# Patient Record
Sex: Female | Born: 1945 | Race: White | Hispanic: No | Marital: Married | Smoking: Never smoker
Health system: Southern US, Community
[De-identification: ages and names within clinical notes are randomized; demographics above are authoritative.]

## PROBLEM LIST (undated history)

## (undated) DIAGNOSIS — H9319 Tinnitus, unspecified ear: Secondary | ICD-10-CM

## (undated) DIAGNOSIS — I10 Essential (primary) hypertension: Secondary | ICD-10-CM

## (undated) DIAGNOSIS — Z860100 Personal history of colon polyps, unspecified: Secondary | ICD-10-CM

## (undated) DIAGNOSIS — E559 Vitamin D deficiency, unspecified: Secondary | ICD-10-CM

## (undated) DIAGNOSIS — E7800 Pure hypercholesterolemia, unspecified: Secondary | ICD-10-CM

## (undated) DIAGNOSIS — E785 Hyperlipidemia, unspecified: Secondary | ICD-10-CM

## (undated) DIAGNOSIS — E2839 Other primary ovarian failure: Secondary | ICD-10-CM

## (undated) HISTORY — DX: Essential (primary) hypertension: I10

## (undated) HISTORY — DX: Other primary ovarian failure: E28.39

## (undated) HISTORY — DX: Pure hypercholesterolemia, unspecified: E78.00

## (undated) HISTORY — DX: Personal history of colonic polyps: Z86.010

## (undated) HISTORY — DX: Tinnitus, unspecified ear: H93.19

## (undated) HISTORY — DX: Personal history of colon polyps, unspecified: Z86.0100

## (undated) HISTORY — DX: Vitamin D deficiency, unspecified: E55.9

## (undated) HISTORY — DX: Hyperlipidemia, unspecified: E78.5

---

## 1998-01-14 ENCOUNTER — Other Ambulatory Visit: Admission: RE | Admit: 1998-01-14 | Discharge: 1998-01-14 | Payer: Self-pay | Admitting: Radiology

## 2000-10-15 ENCOUNTER — Ambulatory Visit (HOSPITAL_COMMUNITY): Admission: RE | Admit: 2000-10-15 | Discharge: 2000-10-15 | Payer: Self-pay | Admitting: Gastroenterology

## 2000-10-15 ENCOUNTER — Encounter (INDEPENDENT_AMBULATORY_CARE_PROVIDER_SITE_OTHER): Payer: Self-pay

## 2004-02-09 ENCOUNTER — Other Ambulatory Visit: Admission: RE | Admit: 2004-02-09 | Discharge: 2004-02-09 | Payer: Self-pay | Admitting: Obstetrics and Gynecology

## 2007-02-27 ENCOUNTER — Other Ambulatory Visit: Admission: RE | Admit: 2007-02-27 | Discharge: 2007-02-27 | Payer: Self-pay | Admitting: Family Medicine

## 2007-02-27 ENCOUNTER — Ambulatory Visit: Payer: Self-pay | Admitting: Family Medicine

## 2008-09-07 ENCOUNTER — Ambulatory Visit: Payer: Self-pay | Admitting: Family Medicine

## 2009-12-07 ENCOUNTER — Ambulatory Visit: Payer: Self-pay | Admitting: Family Medicine

## 2009-12-07 ENCOUNTER — Other Ambulatory Visit: Admission: RE | Admit: 2009-12-07 | Discharge: 2009-12-07 | Payer: Self-pay | Admitting: Family Medicine

## 2010-08-04 NOTE — Procedures (Signed)
[facility]  Patient:    Taylor Vasquez, Taylor Vasquez                         MRN: [id number] Proc. Date: 10/15/00 Adm. Date:  [id number] Attending:  Louie Bun CC:         Ronnald Nian, M.D.   Procedure Report  PROCEDURE:  Colonoscopy.  INDICATION FOR PROCEDURE:  History of adenomatous colon polyps on ______ colonoscopy three years ago.  DESCRIPTION OF PROCEDURE:  The patient was placed in the left lateral decubitus position and placed on the pulse monitor with continuous low-flow oxygen delivered by nasal cannula.  She was sedated with 70 mg IV Demerol and 10 mg IV Versed.  The Olympus video colonoscope was inserted into the rectum and advanced to the cecum, confirmed by transillumination at McBurneys point and visualization of the ileocecal valve and appendiceal orifice.  The prep was suboptimal in the right colon, especially in the cecum, requiring a significant amount of water irrigation.  Despite irrigation, I could not rule out small lesions less than 1 cm in all locations in the cecum.  Otherwise, the cecum appeared normal.  Within the mid ascending colon, there was an 8 mm sessile polyp which was fulgurated by hot biopsy.  The remainder of the ascending and transverse colon appeared normal.  There were a few scattered diverticula in the descending and sigmoid colon.  There were no other abnormalities noted.  The rectum appeared normal, and retroflexed view of the anus revealed no obvious internal hemorrhoids.  The colonoscope was then withdrawn, and the the patient returned to the recovery room in stable condition.  She tolerated the procedure well, and there were no immediate complications.  IMPRESSION: 1. Ascending colon polyp. 2. Sigmoid and descending colon diverticulosis.  PLAN:  Await histology and will repeat colonoscopy in 3-5 years. DD:  10/15/00 TD:  10/15/00 Job: [id number]

## 2010-11-22 ENCOUNTER — Telehealth: Payer: Self-pay | Admitting: Family Medicine

## 2010-11-22 NOTE — Telephone Encounter (Signed)
Send a prescription

## 2010-11-22 NOTE — Telephone Encounter (Signed)
Faxed

## 2012-01-10 ENCOUNTER — Encounter: Payer: Self-pay | Admitting: Internal Medicine

## 2012-11-06 ENCOUNTER — Encounter: Payer: Self-pay | Admitting: Internal Medicine

## 2013-01-14 ENCOUNTER — Encounter: Payer: Self-pay | Admitting: Internal Medicine

## 2014-07-20 ENCOUNTER — Other Ambulatory Visit: Payer: Self-pay

## 2014-07-20 DIAGNOSIS — Z1231 Encounter for screening mammogram for malignant neoplasm of breast: Secondary | ICD-10-CM

## 2014-07-23 ENCOUNTER — Ambulatory Visit
Admission: RE | Admit: 2014-07-23 | Discharge: 2014-07-23 | Disposition: A | Discharge status: Home / Self Care | Payer: Medicare PPO | Source: Ambulatory Visit

## 2014-07-23 DIAGNOSIS — Z1231 Encounter for screening mammogram for malignant neoplasm of breast: Secondary | ICD-10-CM

## 2014-08-04 ENCOUNTER — Other Ambulatory Visit: Payer: Self-pay | Admitting: Family Medicine

## 2014-08-04 DIAGNOSIS — R928 Other abnormal and inconclusive findings on diagnostic imaging of breast: Secondary | ICD-10-CM

## 2014-08-11 ENCOUNTER — Ambulatory Visit
Admission: RE | Admit: 2014-08-11 | Discharge: 2014-08-11 | Disposition: A | Discharge status: Home / Self Care | Payer: Medicare PPO | Source: Ambulatory Visit | Attending: Family Medicine | Admitting: Family Medicine

## 2014-08-11 DIAGNOSIS — R928 Other abnormal and inconclusive findings on diagnostic imaging of breast: Secondary | ICD-10-CM

## 2014-10-20 DIAGNOSIS — Z86010 Personal history of colonic polyps: Secondary | ICD-10-CM | POA: Diagnosis not present

## 2014-10-20 DIAGNOSIS — K641 Second degree hemorrhoids: Secondary | ICD-10-CM | POA: Diagnosis not present

## 2014-10-20 DIAGNOSIS — Z09 Encounter for follow-up examination after completed treatment for conditions other than malignant neoplasm: Secondary | ICD-10-CM | POA: Diagnosis not present

## 2014-10-20 DIAGNOSIS — K5730 Diverticulosis of large intestine without perforation or abscess without bleeding: Secondary | ICD-10-CM | POA: Diagnosis not present

## 2014-11-16 ENCOUNTER — Emergency Department (HOSPITAL_BASED_OUTPATIENT_CLINIC_OR_DEPARTMENT_OTHER)
Admission: EM | Admit: 2014-11-16 | Discharge: 2014-11-16 | Disposition: A | Discharge status: Home / Self Care | Payer: Medicare PPO | Attending: Emergency Medicine | Admitting: Emergency Medicine

## 2014-11-16 ENCOUNTER — Encounter (HOSPITAL_BASED_OUTPATIENT_CLINIC_OR_DEPARTMENT_OTHER): Payer: Self-pay | Admitting: *Deleted

## 2014-11-16 ENCOUNTER — Emergency Department (HOSPITAL_BASED_OUTPATIENT_CLINIC_OR_DEPARTMENT_OTHER): Payer: Medicare PPO

## 2014-11-16 DIAGNOSIS — Y998 Other external cause status: Secondary | ICD-10-CM | POA: Insufficient documentation

## 2014-11-16 DIAGNOSIS — M25531 Pain in right wrist: Secondary | ICD-10-CM | POA: Diagnosis not present

## 2014-11-16 DIAGNOSIS — Y9389 Activity, other specified: Secondary | ICD-10-CM | POA: Diagnosis not present

## 2014-11-16 DIAGNOSIS — W010XXA Fall on same level from slipping, tripping and stumbling without subsequent striking against object, initial encounter: Secondary | ICD-10-CM | POA: Insufficient documentation

## 2014-11-16 DIAGNOSIS — Y92007 Garden or yard of unspecified non-institutional (private) residence as the place of occurrence of the external cause: Secondary | ICD-10-CM | POA: Diagnosis not present

## 2014-11-16 DIAGNOSIS — S299XXA Unspecified injury of thorax, initial encounter: Secondary | ICD-10-CM | POA: Diagnosis not present

## 2014-11-16 DIAGNOSIS — S60221A Contusion of right hand, initial encounter: Secondary | ICD-10-CM | POA: Insufficient documentation

## 2014-11-16 DIAGNOSIS — T07XXXA Unspecified multiple injuries, initial encounter: Secondary | ICD-10-CM

## 2014-11-16 DIAGNOSIS — R0781 Pleurodynia: Secondary | ICD-10-CM | POA: Diagnosis not present

## 2014-11-16 DIAGNOSIS — W19XXXA Unspecified fall, initial encounter: Secondary | ICD-10-CM

## 2014-11-16 DIAGNOSIS — S0083XA Contusion of other part of head, initial encounter: Secondary | ICD-10-CM | POA: Diagnosis not present

## 2014-11-16 DIAGNOSIS — S2231XA Fracture of one rib, right side, initial encounter for closed fracture: Secondary | ICD-10-CM | POA: Diagnosis not present

## 2014-11-16 DIAGNOSIS — S5011XA Contusion of right forearm, initial encounter: Secondary | ICD-10-CM | POA: Diagnosis not present

## 2014-11-16 DIAGNOSIS — S6991XA Unspecified injury of right wrist, hand and finger(s), initial encounter: Secondary | ICD-10-CM | POA: Diagnosis not present

## 2014-11-16 MED ORDER — HYDROCODONE-ACETAMINOPHEN 5-325 MG PO TABS: 1.0000 | ORAL_TABLET | Freq: Four times a day (QID) | ORAL | Status: DC | PRN

## 2014-11-16 NOTE — Discharge Instructions (Signed)
Contusion °A contusion is a deep bruise. Contusions happen when an injury causes bleeding under the skin. Signs of bruising include pain, puffiness (swelling), and discolored skin. The contusion may turn blue, purple, or yellow. °HOME CARE  °· Put ice on the injured area. °¨ Put ice in a plastic bag. °¨ Place a towel between your skin and the bag. °¨ Leave the ice on for 15-20 minutes, 03-04 times a day. °· Only take medicine as told by your doctor. °· Rest the injured area. °· If possible, raise (elevate) the injured area to lessen puffiness. °GET HELP RIGHT AWAY IF:  °· You have more bruising or puffiness. °· You have pain that is getting worse. °· Your puffiness or pain is not helped by medicine. °MAKE SURE YOU:  °· Understand these instructions. °· Will watch your condition. °· Will get help right away if you are not doing well or get worse. °Document Released: 08/22/2007 Document Revised: 05/28/2011 Document Reviewed: 01/08/2011 °ExitCare® Patient Information ©2015 [employer]. This information is not intended to replace advice given to you by your health care provider. Make sure you discuss any questions you have with your health care provider. ° °

## 2014-11-16 NOTE — ED Notes (Addendum)
Tripped and fell in the yard. Injury to her right wrist and right ribs. Ice on her wrist upon arrival to triage. Skin flap laceration to the palm of her left hand.

## 2014-11-16 NOTE — ED Provider Notes (Signed)
CSN: [id number]     Arrival date & time 11/16/14  1804 History  This chart was scribe for Gwyneth Sprout, MD by Angelene Giovanni, ED Scribe. The patient was seen in room MH09/MH09 and the patient's care was started at 6:23 PM.    Chief Complaint  Patient presents with  . Fall  . Wrist Injury   Patient is a 69 y.o. female presenting with fall and wrist injury. The history is provided by the patient. No language interpreter was used.  Fall This is a new problem. The current episode started less than 1 hour ago. Pertinent negatives include no shortness of breath. Nothing aggravates the symptoms. Nothing relieves the symptoms. She has tried nothing for the symptoms.  Wrist Injury Associated symptoms: no fever    HPI Comments: ANAPAOLA KINSEL is a 69 y.o. female who presents to the Emergency Department status post fall that occurred today PTA. She reports that she fell in the yard, falling head first catching herself with her bilateral arms as she fell unto grass. She denies any head injuries or LOC. She reports associated right wrist and right lower arm pain, laceration in her left palm with foreign objects, and right upper rib pain. She denies any right elbow pain or SOB. Pt also denies any blood thinner use. No alleviating symptoms noted.   History reviewed. No pertinent past medical history. History reviewed. No pertinent past surgical history. No family history on file. Social History  Substance Use Topics  . Smoking status: Never Smoker   . Smokeless tobacco: None  . Alcohol Use: Yes     Comment: daily   OB History    No data available     Review of Systems  Constitutional: Negative for fever and chills.  Respiratory: Negative for shortness of breath.   Musculoskeletal: Positive for myalgias and arthralgias. Negative for joint swelling.  Skin: Positive for wound.  Neurological: Negative for light-headedness.      Allergies  Review of patient's allergies indicates no known  allergies.  Home Medications   Prior to Admission medications   Not on File   BP 146/102 mmHg  Pulse 70  Temp(Src) 98 F (36.7 C) (Oral)  Resp 20  Ht  (1.753 m)  Wt 160 lb (72.576 kg)  BMI 23.62 kg/m2  SpO2 100% Physical Exam  Constitutional: She is oriented to person, place, and time. She appears well-developed and well-nourished. No distress.  HENT:  Head: Normocephalic and atraumatic.  Eyes: Conjunctivae and EOM are normal.  Neck: Neck supple. No tracheal deviation present.  No cervical spine or muscular tenderness  Cardiovascular: Normal rate, regular rhythm and normal heart sounds.   No murmur heard. Pulmonary/Chest: Effort normal. No respiratory distress.  Mild anterior chest wall tenderness under right breast  Musculoskeletal: Normal range of motion. She exhibits tenderness.  No significant L hand tenderness Significant bruising over hypothenar and thenar eminencies  No significant metacarpal tenderness on right No snuffbox tenderness Tenderness and swelling on ventral aspect of distal R radius and radial head tenderness Normal R elbow  Neurological: She is alert and oriented to person, place, and time.  Skin: Skin is warm and dry.  Superficial abra to r side of face Superficial tear to left palm with dirt present in wound   Psychiatric: She has a normal mood and affect. Her behavior is normal.  Nursing note and vitals reviewed.   ED Course  Procedures (including critical care time) DIAGNOSTIC STUDIES: Oxygen Saturation is 100% on RA,  normal by my interpretation.    COORDINATION OF CARE: 6:23 PM- Pt advised of plan for treatment and pt agrees.    Labs Review Labs Reviewed - No data to display  Imaging Review Dg Ribs Unilateral W/chest Right  11/16/2014   CLINICAL DATA:  Status post fall this evening with right rib pain.  EXAM: RIGHT RIBS AND CHEST - 3+ VIEW  COMPARISON:  None.  FINDINGS: There is minimal cortical deformity of the lateral right  probably sixth rib, nondisplaced fracture is not excluded. The visualized lung fields are normal. The mediastinal contour and cardiac silhouette are normal.  IMPRESSION: There is minimal cortical deformity of the lateral right probably sixth rib, nondisplaced fracture is not excluded   Electronically Signed   By: Sherian Rein M.D.   On: 11/16/2014 19:30   Dg Forearm Right  11/16/2014   CLINICAL DATA:  Fall in [employer]. Right forearm injury, distal pain, bruising.  EXAM: RIGHT FOREARM - 2 VIEW  COMPARISON:  None.  FINDINGS: There is no evidence of fracture or other focal bone lesions. Soft tissues are unremarkable.  IMPRESSION: Negative.   Electronically Signed   By: Charlett Nose M.D.   On: 11/16/2014 19:30   Dg Wrist Complete Right  11/16/2014   CLINICAL DATA:  Status post fall this evening with right wrist pain.  EXAM: RIGHT WRIST - COMPLETE 3+ VIEW  COMPARISON:  None.  FINDINGS: There is no evidence of fracture or dislocation. There is a lytic lesion in the scaphoid with narrow zone the transition. This is nonspecific. Soft tissues are unremarkable.  IMPRESSION: No acute fracture or dislocation.   Electronically Signed   By: Sherian Rein M.D.   On: 11/16/2014 19:31   I have personally reviewed and evaluated these images and lab results as part of my medical decision-making.   EKG Interpretation None      MDM   Final diagnoses:  Fall, initial encounter  Contusion of multiple sites  Rib fracture, right, closed, initial encounter   patient with a mechanical fall today when she was running after the yard guide. She tripped and fell landing on bilateral outstretched hands and right arm. She has several areas of ecchymosis but most pronounced tenderness is the right wrist and radial head. She denies any loss of consciousness or head injury to does have superficial abrasions to the right side of her face.  No anticoagulation or significant medical problems. Patient was able to ambulate without  difficulty. Patient also having mild right anterior rib pain under her breast. No shortness of breath or abdominal tenderness.  Chest x-ray, forearm and wrist films pending.  7:44 PM Imaging showed a right sixth rib fracture but otherwise negative films. Patient refusing any stronger pain medication she will use ibuprofen or Tylenol. She was discharged home in good condition.  I personally performed the services described in this documentation, which was scribed in my presence.  The recorded information has been reviewed and considered.    Gwyneth Sprout, MD 11/16/14 1944

## 2014-11-16 NOTE — ED Notes (Signed)
Pt. Has abrasion noted to the R side of her upper face and upper R forehead with no noted LOC or eye involovment.    Pt. Has noted bruising and pain to the R lower arm and wrist and the R fib area.  Pt. Also has less strength with R grip with her hand than the L hand grip. Pt. Has an open skin tear on the L palm side of her Hand.

## 2015-01-14 DIAGNOSIS — Z85828 Personal history of other malignant neoplasm of skin: Secondary | ICD-10-CM | POA: Diagnosis not present

## 2015-01-14 DIAGNOSIS — L570 Actinic keratosis: Secondary | ICD-10-CM | POA: Diagnosis not present

## 2015-07-05 ENCOUNTER — Other Ambulatory Visit: Payer: Self-pay

## 2015-07-05 DIAGNOSIS — Z1231 Encounter for screening mammogram for malignant neoplasm of breast: Secondary | ICD-10-CM

## 2015-07-25 ENCOUNTER — Ambulatory Visit
Admission: RE | Admit: 2015-07-25 | Discharge: 2015-07-25 | Disposition: A | Discharge status: Home / Self Care | Payer: Medicare Other | Source: Ambulatory Visit

## 2015-07-25 DIAGNOSIS — Z1231 Encounter for screening mammogram for malignant neoplasm of breast: Secondary | ICD-10-CM

## 2016-02-05 ENCOUNTER — Emergency Department (HOSPITAL_COMMUNITY)
Admission: EM | Admit: 2016-02-05 | Discharge: 2016-02-06 | Disposition: A | Discharge status: Home / Self Care | Payer: Medicare Other | Attending: Emergency Medicine | Admitting: Emergency Medicine

## 2016-02-05 ENCOUNTER — Encounter (HOSPITAL_COMMUNITY): Payer: Self-pay | Admitting: Emergency Medicine

## 2016-02-05 DIAGNOSIS — Y929 Unspecified place or not applicable: Secondary | ICD-10-CM | POA: Diagnosis not present

## 2016-02-05 DIAGNOSIS — S0993XA Unspecified injury of face, initial encounter: Secondary | ICD-10-CM | POA: Diagnosis present

## 2016-02-05 DIAGNOSIS — S022XXA Fracture of nasal bones, initial encounter for closed fracture: Secondary | ICD-10-CM | POA: Insufficient documentation

## 2016-02-05 DIAGNOSIS — Z79899 Other long term (current) drug therapy: Secondary | ICD-10-CM | POA: Insufficient documentation

## 2016-02-05 DIAGNOSIS — S0012XA Contusion of left eyelid and periocular area, initial encounter: Secondary | ICD-10-CM | POA: Insufficient documentation

## 2016-02-05 DIAGNOSIS — Y939 Activity, unspecified: Secondary | ICD-10-CM | POA: Diagnosis not present

## 2016-02-05 DIAGNOSIS — R040 Epistaxis: Secondary | ICD-10-CM

## 2016-02-05 DIAGNOSIS — S0240DA Maxillary fracture, left side, initial encounter for closed fracture: Secondary | ICD-10-CM | POA: Insufficient documentation

## 2016-02-05 DIAGNOSIS — S0512XA Contusion of eyeball and orbital tissues, left eye, initial encounter: Secondary | ICD-10-CM

## 2016-02-05 DIAGNOSIS — Y999 Unspecified external cause status: Secondary | ICD-10-CM | POA: Insufficient documentation

## 2016-02-05 DIAGNOSIS — W01190A Fall on same level from slipping, tripping and stumbling with subsequent striking against furniture, initial encounter: Secondary | ICD-10-CM | POA: Diagnosis not present

## 2016-02-05 DIAGNOSIS — S02401A Maxillary fracture, unspecified, initial encounter for closed fracture: Secondary | ICD-10-CM

## 2016-02-05 NOTE — ED Triage Notes (Addendum)
Pt reports that she tripped and fell hitting left eye on a door knob. Pt reports injury occurred at 2315 tonight. Pt denies any LOC, blurred vision, or being on any anticoagulant therapy. Pt AxO x4 at this time. Swelling and bruising noted to left orbital area.

## 2016-02-06 ENCOUNTER — Emergency Department (HOSPITAL_COMMUNITY): Payer: Medicare Other

## 2016-02-06 MED ORDER — NAPROXEN 500 MG PO TABS: 500.0000 mg | ORAL_TABLET | Freq: Two times a day (BID) | ORAL | 0 refills | Status: DC

## 2016-02-06 MED ORDER — CLINDAMYCIN HCL 150 MG PO CAPS: 300.0000 mg | ORAL_CAPSULE | Freq: Three times a day (TID) | ORAL | 0 refills | Status: DC

## 2016-02-06 MED ORDER — TETRACAINE HCL 0.5 % OP SOLN
1.0000 [drp] | Freq: Once | OPHTHALMIC | Status: AC
  Administered 2016-02-06: 1 [drp] via OPHTHALMIC
  Filled 2016-02-06: qty 4

## 2016-02-06 MED ORDER — FLUORESCEIN SODIUM 1 MG OP STRP
1.0000 | ORAL_STRIP | Freq: Once | OPHTHALMIC | Status: AC
  Administered 2016-02-06: 1 via OPHTHALMIC
  Filled 2016-02-06: qty 1

## 2016-02-06 NOTE — ED Notes (Signed)
Quality of visual acuity chart is poor which may account for some of the results from visual acuity screening

## 2016-02-06 NOTE — ED Notes (Signed)
Tono pen and woods lamp at the bedside per verbal order from [place]

## 2016-02-06 NOTE — ED Provider Notes (Signed)
[facility]-EMERGENCY DEPT Provider Note   CSN: [id number] Arrival date & time: 02/05/16  2348 By signing my name below, I, Taylor Vasquez, attest that this documentation has been prepared under the direction and in the presence of non-physician practitioner, Antony Madura, PA-C.  Electronically Signed: Levon Vasquez, Scribe. 02/06/2016. 12:13 AM.   History   Chief Complaint Chief Complaint  Patient presents with  . Facial Injury   HPI Taylor Vasquez is a 70 y.o. female who presents to the Emergency Department complaining of an injury to her left eye sustained about one hour ago. Per pt's friend, pt tripped and fell, hitting her left eye on a door knob. There is associated swelling and ecchymosis to the area. No alleviating or modifying factors noted. No OTC medications taken PTA. Tetanus UTD. She is not on any anticoagulants. Pt denies any pain, headache, LOC, blurred vision, dental pain, nausea, or vomiting.   The history is provided by the patient. No language interpreter was used.   No past medical history on file.  There are no active problems to display for this patient.   No past surgical history on file.  OB History    No data available      Home Medications    Prior to Admission medications   Medication Sig Start Date End Date Taking? Authorizing Provider  clindamycin (CLEOCIN) 150 MG capsule Take 2 capsules (300 mg total) by mouth 3 (three) times daily. May dispense as 150mg  capsules 02/06/16   Antony Madura, PA-C  HYDROcodone-acetaminophen (NORCO/VICODIN) 5-325 MG per tablet Take 1 tablet by mouth every 6 (six) hours as needed. 11/16/14   Gwyneth Sprout, MD  naproxen (NAPROSYN) 500 MG tablet Take 1 tablet (500 mg total) by mouth 2 (two) times daily. 02/06/16   Antony Madura, PA-C    Family History No family history on file.  Social History Social History  Substance Use Topics  . Smoking status: Never Smoker  . Smokeless tobacco: Never Used  . Alcohol use Yes   Comment: daily    Allergies   Patient has no known allergies.  Review of Systems Review of Systems 10 systems reviewed and all are negative for acute change except as noted in the HPI.    Physical Exam Updated Vital Signs BP 173/99 (BP Location: Left Arm)   Pulse 77   Resp 18   Ht 5\' 8"  (1.727 m)   Wt 74.4 kg   SpO2 98%   BMI 24.94 kg/m   Physical Exam  Constitutional: She is oriented to person, place, and time. She appears well-developed and well-nourished. No distress.  Nontoxic and in NAD  HENT:  Head: Normocephalic. Head is without Battle's sign.    No hemotympanum bilaterally. No loose dentition. There is a small 0.5cm laceration to the inner portion of the left upper lip. Mild blood in left nare. No septal deviation or hematoma.  Eyes: Conjunctivae and EOM are normal. Pupils are equal, round, and reactive to light. No scleral icterus.  No subconjunctival hemorrhage. EOMs normal. No nystagmus noted. PERRL. All visual fields intact. There is an infraorbital contusion. No pain with eye movement. No uptake on fluorescein staining. IOP 12 OS with 95% CI. Negative Seidel sign.  Neck: Normal range of motion.  Cardiovascular: Normal rate, regular rhythm and intact distal pulses.   Pulmonary/Chest: Effort normal. No respiratory distress.  Respirations even and unlabored  Musculoskeletal: Normal range of motion.  Neurological: She is alert and oriented to person, place, and time. She exhibits normal  muscle tone. Coordination normal.  Skin: Skin is warm and dry. No rash noted. She is not diaphoretic. No erythema. No pallor.  Psychiatric: She has a normal mood and affect. Her behavior is normal.  Nursing note and vitals reviewed.   ED Treatments / Results  DIAGNOSTIC STUDIES:  Oxygen Saturation is 98% on RA, normal by my interpretation.    COORDINATION OF CARE:  12:12 AM Discussed treatment plan with pt at bedside and pt agreed to plan.   Labs (all labs ordered are  listed, but only abnormal results are displayed) Labs Reviewed - No data to display  EKG  EKG Interpretation None       Radiology Ct Maxillofacial Wo Contrast  Result Date: 02/06/2016 CLINICAL DATA:  Tripped over rug and hit left orbit on doorknob, with erythema and swelling about the left eye and upper left lip. Initial encounter. EXAM: CT MAXILLOFACIAL WITHOUT CONTRAST TECHNIQUE: Multidetector CT imaging of the maxillofacial structures was performed. Multiplanar CT image reconstructions were also generated. A small metallic BB was placed on the right temple in order to reliably differentiate right from left. COMPARISON:  None. FINDINGS: Osseous: There is a mildly depressed fracture involving the anterior wall of the left maxillary sinus, and a minimally displaced fracture involving the left side of the nasal bone. There is suggestion of a fracture involving the lateral wall of the left maxillary sinus, extending minimally to the posterior left orbital floor. The mandible appears intact. The visualized dentition demonstrates no acute abnormality. Orbits: The orbits are are otherwise intact. There is no evidence of herniation of intraorbital fat or entrapment. Sinuses: There is partial opacification of the left maxillary sinus with blood. The remaining visualized paranasal sinuses and mastoid air cells are well-aerated. Soft tissues: Prominent soft tissue swelling is noted surrounding the left orbit and overlying the left maxilla, tracking inferiorly along the left side of the face. Minimal associated soft tissue air is seen at the site of fracture. Mild calcification is noted at the carotid bifurcations bilaterally. The parapharyngeal fat planes are preserved. The nasopharynx, oropharynx and hypopharynx are unremarkable in appearance. The visualized portions of the valleculae and piriform sinuses are grossly unremarkable. The parotid and submandibular glands are within normal limits. No cervical  lymphadenopathy is seen. Limited intracranial: Mild cortical volume loss is noted, with mild periventricular matter change likely reflecting small vessel ischemic microangiopathy. IMPRESSION: 1. Mildly depressed fracture involving the anterior wall of the left maxillary sinus, and minimally displaced fracture involving the left side of the nasal bone. 2. Suggestion of fracture involving the lateral wall of the left maxillary sinus, extending minimally to the posterior left orbital floor. 3. Partial opacification of the left maxillary sinus with blood. 4. Prominent soft tissue swelling surrounding the left orbit and overlying the left maxilla, tracking inferiorly along the left side of the face. Minimal associated soft tissue air seen at the site of fracture. 5. Mild cortical volume loss and scattered small vessel ischemic microangiopathy. 6. Mild calcification at the carotid bifurcations bilaterally. Carotid ultrasound could be considered for further evaluation, when and as deemed clinically appropriate. Electronically Signed   By: Roanna RaiderJeffery  Chang M.D.   On: 02/06/2016 01:00    Procedures Procedures (including critical care time)  Medications Ordered in ED Medications  fluorescein ophthalmic strip 1 strip (1 strip Both Eyes Given 02/06/16 0046)  tetracaine (PONTOCAINE) 0.5 % ophthalmic solution 1 drop (1 drop Left Eye Given 02/06/16 0046)     Initial Impression / Assessment and Plan /  ED Course  I have reviewed the triage vital signs and the nursing notes.  Pertinent labs & imaging results that were available during my care of the patient were reviewed by me and considered in my medical decision making (see chart for details).  Clinical Course     70 year old female percent to the emergency department for evaluation of injuries following a fall. No reported loss of consciousness. No corneal abrasion or concern for globe rupture. Intraocular pressure is normal. No pain with extraocular movements.  CT shows evidence of a mildly displaced fracture to the anterior wall of the left maxillary sinus as well as fracture along the lateral wall of the left maxillary sinus. There is a minimally displaced fracture involving the left side of the nasal bone. These findings have been relayed to the patient who verbalizes understanding. Patient placed on clindamycin for infection prophylaxis. Tetanus is up-to-date. Ibuprofen advised for pain control and ENT referral provided. Patient discharged in stable condition with no unaddressed concerns.   Final Clinical Impressions(s) / ED Diagnoses   Final diagnoses:  Closed fracture of nasal bone, initial encounter  Closed fracture of maxillary sinus, initial encounter (HCC)  Periorbital contusion of left eye, initial encounter  Epistaxis   New Prescriptions New Prescriptions   CLINDAMYCIN (CLEOCIN) 150 MG CAPSULE    Take 2 capsules (300 mg total) by mouth 3 (three) times daily. May dispense as 150mg  capsules   NAPROXEN (NAPROSYN) 500 MG TABLET    Take 1 tablet (500 mg total) by mouth 2 (two) times daily.   I personally performed the services described in this documentation, which was scribed in my presence. The recorded information has been reviewed and is accurate.      Antony MaduraKelly Sidra Oldfield, PA-C 02/06/16 0207    Cy BlamerApril Palumbo, MD 02/06/16 [phone]

## 2016-02-06 NOTE — Discharge Instructions (Signed)
Take clindamycin as prescribed until finished. Take naproxen as needed for pain control. Follow-up with an ENT physician to ensure resolution of symptoms. Return to the ED for new or concerning symptoms.

## 2016-06-22 ENCOUNTER — Other Ambulatory Visit: Payer: Self-pay | Admitting: Family Medicine

## 2016-06-22 DIAGNOSIS — Z1231 Encounter for screening mammogram for malignant neoplasm of breast: Secondary | ICD-10-CM

## 2016-07-30 ENCOUNTER — Ambulatory Visit
Admission: RE | Admit: 2016-07-30 | Discharge: 2016-07-30 | Disposition: A | Discharge status: Home / Self Care | Payer: Medicare Other | Source: Ambulatory Visit | Attending: Family Medicine | Admitting: Family Medicine

## 2016-07-30 DIAGNOSIS — Z1231 Encounter for screening mammogram for malignant neoplasm of breast: Secondary | ICD-10-CM

## 2017-06-28 ENCOUNTER — Other Ambulatory Visit: Payer: Self-pay | Admitting: Family Medicine

## 2017-06-28 DIAGNOSIS — Z139 Encounter for screening, unspecified: Secondary | ICD-10-CM

## 2017-07-31 ENCOUNTER — Ambulatory Visit
Admission: RE | Admit: 2017-07-31 | Discharge: 2017-07-31 | Disposition: A | Discharge status: Home / Self Care | Payer: Medicare Other | Source: Ambulatory Visit | Attending: Family Medicine | Admitting: Family Medicine

## 2017-07-31 DIAGNOSIS — Z139 Encounter for screening, unspecified: Secondary | ICD-10-CM

## 2018-08-21 ENCOUNTER — Other Ambulatory Visit: Payer: Self-pay | Admitting: Family Medicine

## 2018-08-21 DIAGNOSIS — Z1231 Encounter for screening mammogram for malignant neoplasm of breast: Secondary | ICD-10-CM

## 2018-10-30 ENCOUNTER — Other Ambulatory Visit: Payer: Self-pay | Admitting: Family Medicine

## 2018-10-30 DIAGNOSIS — Z1231 Encounter for screening mammogram for malignant neoplasm of breast: Secondary | ICD-10-CM

## 2018-12-17 ENCOUNTER — Other Ambulatory Visit: Payer: Self-pay

## 2018-12-17 ENCOUNTER — Ambulatory Visit
Admission: RE | Admit: 2018-12-17 | Discharge: 2018-12-17 | Disposition: A | Discharge status: Home / Self Care | Payer: Medicare Other | Source: Ambulatory Visit | Attending: Family Medicine | Admitting: Family Medicine

## 2018-12-17 DIAGNOSIS — Z1231 Encounter for screening mammogram for malignant neoplasm of breast: Secondary | ICD-10-CM

## 2019-05-06 ENCOUNTER — Ambulatory Visit: Payer: Medicare Other

## 2019-11-05 ENCOUNTER — Encounter: Payer: Self-pay | Admitting: Neurology

## 2019-11-09 ENCOUNTER — Other Ambulatory Visit: Payer: Self-pay | Admitting: Family Medicine

## 2019-11-09 DIAGNOSIS — R5381 Other malaise: Secondary | ICD-10-CM

## 2019-11-11 ENCOUNTER — Other Ambulatory Visit: Payer: Self-pay | Admitting: Family Medicine

## 2019-11-11 DIAGNOSIS — M8580 Other specified disorders of bone density and structure, unspecified site: Secondary | ICD-10-CM

## 2019-11-16 ENCOUNTER — Other Ambulatory Visit: Payer: Self-pay | Admitting: Family Medicine

## 2019-11-16 DIAGNOSIS — E2839 Other primary ovarian failure: Secondary | ICD-10-CM

## 2019-11-16 DIAGNOSIS — Z1231 Encounter for screening mammogram for malignant neoplasm of breast: Secondary | ICD-10-CM

## 2019-11-16 DIAGNOSIS — M8580 Other specified disorders of bone density and structure, unspecified site: Secondary | ICD-10-CM

## 2019-11-27 ENCOUNTER — Ambulatory Visit: Payer: Self-pay

## 2019-11-27 DIAGNOSIS — Z23 Encounter for immunization: Secondary | ICD-10-CM

## 2019-12-18 ENCOUNTER — Ambulatory Visit: Payer: Medicare Other

## 2019-12-22 ENCOUNTER — Ambulatory Visit: Payer: Medicare Other

## 2020-01-15 ENCOUNTER — Other Ambulatory Visit: Payer: Self-pay

## 2020-01-15 ENCOUNTER — Ambulatory Visit
Admission: RE | Admit: 2020-01-15 | Discharge: 2020-01-15 | Disposition: A | Discharge status: Home / Self Care | Payer: Self-pay | Source: Ambulatory Visit | Attending: Family Medicine | Admitting: Family Medicine

## 2020-01-15 DIAGNOSIS — Z1231 Encounter for screening mammogram for malignant neoplasm of breast: Secondary | ICD-10-CM | POA: Diagnosis not present

## 2020-02-09 ENCOUNTER — Ambulatory Visit: Payer: Medicare Other | Admitting: Neurology

## 2020-03-02 ENCOUNTER — Other Ambulatory Visit: Payer: Self-pay

## 2020-03-02 ENCOUNTER — Ambulatory Visit
Admission: RE | Admit: 2020-03-02 | Discharge: 2020-03-02 | Disposition: A | Discharge status: Home / Self Care | Payer: Medicare Other | Source: Ambulatory Visit | Attending: Family Medicine | Admitting: Family Medicine

## 2020-03-02 DIAGNOSIS — Z780 Asymptomatic menopausal state: Secondary | ICD-10-CM | POA: Diagnosis not present

## 2020-03-02 DIAGNOSIS — E2839 Other primary ovarian failure: Secondary | ICD-10-CM

## 2020-03-02 DIAGNOSIS — M8580 Other specified disorders of bone density and structure, unspecified site: Secondary | ICD-10-CM

## 2020-03-02 DIAGNOSIS — M85852 Other specified disorders of bone density and structure, left thigh: Secondary | ICD-10-CM | POA: Diagnosis not present

## 2020-05-06 DIAGNOSIS — E559 Vitamin D deficiency, unspecified: Secondary | ICD-10-CM | POA: Diagnosis not present

## 2020-05-06 DIAGNOSIS — R413 Other amnesia: Secondary | ICD-10-CM | POA: Diagnosis not present

## 2020-05-06 DIAGNOSIS — I10 Essential (primary) hypertension: Secondary | ICD-10-CM | POA: Diagnosis not present

## 2020-05-09 ENCOUNTER — Other Ambulatory Visit: Payer: Self-pay

## 2020-05-09 ENCOUNTER — Ambulatory Visit: Payer: Medicare PPO | Admitting: Neurology

## 2020-05-09 ENCOUNTER — Encounter: Payer: Self-pay | Admitting: Neurology

## 2020-05-09 VITALS — BP 143/73 | HR 75 | Ht 68.0 in | Wt 165.8 lb

## 2020-05-09 DIAGNOSIS — F03A0 Unspecified dementia, mild, without behavioral disturbance, psychotic disturbance, mood disturbance, and anxiety: Secondary | ICD-10-CM

## 2020-05-09 DIAGNOSIS — F0390 Unspecified dementia without behavioral disturbance: Secondary | ICD-10-CM

## 2020-05-09 MED ORDER — DONEPEZIL HCL 10 MG PO TABS: ORAL_TABLET | ORAL | 11 refills | Status: DC

## 2020-05-09 NOTE — Patient Instructions (Signed)
1. Schedule MRI brain without contrast  2. Schedule Neurocognitive testing  3. Start Donepezil 10mg : take 1/2 tablet daily for 2 weeks, then increase to 1 tablet daily  4. Follow-up in 6 months, call for any changes   You have been referred for a neuropsychological evaluation (i.e., evaluation of memory and thinking abilities). Please bring someone with you to this appointment if possible, as it is helpful for the doctor to hear from both you and another adult who knows you well. Please bring eyeglasses and hearing aids if you wear them.    The evaluation will take approximately 3 hours and has two parts:   . The first part is a clinical interview with the neuropsychologist (Dr. or Dr. Milbert Coulter). During the interview, the neuropsychologist will speak with you and the individual you brought to the appointment.    . The second part of the evaluation is testing with the doctor's technician Roseanne Reno or Annabelle Harman). During the testing, the technician will ask you to remember different types of material, solve problems, and answer some questionnaires. Your family member will not be present for this portion of the evaluation.   Please note: We must reserve several hours of the neuropsychologist's time and the psychometrician's time for your evaluation appointment. As such, there is a No-Show fee of $100. If you are unable to attend any of your appointments, please contact our office as soon as possible to reschedule.   FALL PRECAUTIONS: Be cautious when walking. Scan the area for obstacles that may increase the risk of trips and falls. When getting up in the mornings, sit up at the edge of the bed for a few minutes before getting out of bed. Consider elevating the bed at the head end to avoid drop of blood pressure when getting up. Walk always in a well-lit room (use night lights in the walls). Avoid area rugs or power cords from appliances in the middle of the walkways. Use a walker or a cane if necessary  and consider physical therapy for balance exercise. Get your eyesight checked regularly.  FINANCIAL OVERSIGHT: Supervision, especially oversight when making financial decisions or transactions is also recommended.  HOME SAFETY: Consider the safety of the kitchen when operating appliances like stoves, microwave oven, and blender. Consider having supervision and share cooking responsibilities until no longer able to participate in those. Accidents with firearms and other hazards in the house should be identified and addressed as well.  DRIVING: Regarding driving, in patients with progressive memory problems, driving will be impaired. We advise to have someone else do the driving if trouble finding directions or if minor accidents are reported. Independent driving assessment is available to determine safety of driving.  ABILITY TO BE LEFT ALONE: If patient is unable to contact 911 operator, consider using LifeLine, or when the need is there, arrange for someone to stay with patients. Smoking is a fire hazard, consider supervision or cessation. Risk of wandering should be assessed by caregiver and if detected at any point, supervision and safe proof recommendations should be instituted.  MEDICATION SUPERVISION: Inability to self-administer medication needs to be constantly addressed. Implement a mechanism to ensure safe administration of the medications.  RECOMMENDATIONS FOR ALL PATIENTS WITH MEMORY PROBLEMS: 1. Continue to exercise (Recommend 30 minutes of walking everyday, or 3 hours every week) 2. Increase social interactions - continue going to [place] and enjoy social gatherings with friends and family 3. Eat healthy, avoid fried foods and eat more fruits and vegetables 4. Maintain adequate  blood pressure, blood sugar, and blood cholesterol level. Reducing the risk of stroke and cardiovascular disease also helps promoting better memory. 5. Avoid stressful situations. Live a simple life and avoid  aggravations. Organize your time and prepare for the next day in anticipation. 6. Sleep well, avoid any interruptions of sleep and avoid any distractions in the bedroom that may interfere with adequate sleep quality 7. Avoid sugar, avoid sweets as there is a strong link between excessive sugar intake, diabetes, and cognitive impairment We discussed the Mediterranean diet, which has been shown to help patients reduce the risk of progressive memory disorders and reduces cardiovascular risk. This includes eating fish, eat fruits and green leafy vegetables, nuts like almonds and hazelnuts, walnuts, and also use olive oil. Avoid fast foods and fried foods as much as possible. Avoid sweets and sugar as sugar use has been linked to worsening of memory function.  There is always a concern of gradual progression of memory problems. If this is the case, then we may need to adjust level of care according to patient needs. Support, both to the patient and caregiver, should then be put into place.

## 2020-05-09 NOTE — Progress Notes (Signed)
NEUROLOGY CONSULTATION NOTE  BRIELE LAGASSE MRN: [id number] DOB: December 10, 1945  Referring provider: Dr. Garth Bigness Primary care provider: Dr. Garth Bigness  Reason for consult:  Memory loss  Dear Dr Chanetta Marshall:  Thank you for your kind referral of Taylor Vasquez for consultation of the above symptoms. Although her history is well known to you, please allow me to reiterate it for the purpose of our medical record. The patient was accompanied to the clinic by [name] who also provides collateral information. Records and images were personally reviewed where available.   HISTORY OF PRESENT ILLNESS: This is a pleasant 75 year old right-handed woman with a history of hypertension, hyperlipidemia, presenting for evaluation of memory loss. [name] is present to provide history. She feels her memory is still okay, probably not as good as it used to be since she is older. [name] has known Ms. Tegtmeyer for 40 years, she started noticing changes around 10 years ago. [name] initially noticed directional changes, [name] would be driving and on the way back home, she would "100% pick the wrong way." They moved back to [facility] in 2015, she had stopped driving just before that. She was not getting lost. [name] had previously worked as her Geophysicist/field seismologist and had always done her medications once it was prescribed for her, otherwise she would forget to take them. [name] took over bills in 2014. She does not cook much, [name] does most of the cooking. She has not left the stove on. She denies misplacing things frequently. She is independent with dressing and bathing, no hygiene concerns. Mood is pretty calm, no paranoia or agitation. Recently she has started seeing people in the backyard every night, saying they are walking down the hill (that is not there). Sleep is good, no wandering behaviors. She denies any headaches, dizziness, diplopia, dysarthria/dysphagia, neck/back pain, bowel/bladder  dysfunction, focal numbness/tingling/weakness, anosmia, or tremors. 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 No significant head injuries. They drink a glass of wine every night.    PAST MEDICAL HISTORY: Past Medical History:  Diagnosis Date  . Hyperlipidemia   . Hypertension     PAST SURGICAL HISTORY: History reviewed. No pertinent surgical history.  MEDICATIONS: Current Outpatient Medications on File Prior to Visit  Medication Sig Dispense Refill  . atorvastatin (LIPITOR) 10 MG tablet 1 tablet    . Calcium-Vitamin D-Vitamin K (VIACTIV CALCIUM PLUS D) 650-12.5-40 MG-MCG-MCG CHEW 1 tablet    . lisinopril (ZESTRIL) 20 MG tablet 1 tablet    . Multiple Vitamins-Minerals (CENTRUM SILVER) tablet 1 tablet     No current facility-administered medications on file prior to visit.    ALLERGIES: No Known Allergies  FAMILY HISTORY: Family History  Problem Relation Age of Onset  . Dementia Mother     SOCIAL HISTORY: Social History   Socioeconomic History  . Marital status: Single    Spouse name: Not on file  . Number of children: Not on file  . Years of education: Not on file  . Highest education level: Not on file  Occupational History  . Not on file  Tobacco Use  . Smoking status: Never Smoker  . Smokeless tobacco: Never Used  Vaping Use  . Vaping Use: Never used  Substance and Sexual Activity  . Alcohol use: Yes    Comment: daily  . Drug use: No  . Sexual activity: Not on file  Other Topics Concern  . Not on file  Social  History Narrative   Right handed    Lives with friend   Social Determinants of Health   Financial Resource Strain: Not on file  Food Insecurity: Not on file  Transportation Needs: Not on file  Physical Activity: Not on file  Stress: Not on file  Social Connections: Not on file  Intimate Partner Violence: Not on file     PHYSICAL EXAM: Vitals:   05/09/20 1025  BP: (!) 143/73  Pulse: 75  SpO2: 95%    General: No acute distress Head:  Normocephalic/atraumatic Skin/Extremities: No rash, no edema Neurological Exam: Mental status: alert and oriented to person, knows she is in a doctor's office, in [place] (where she used to live), state it is March 2024. No dysarthria or aphasia, Fund of knowledge is reduced.  Recent and remote memory are impaired.  Attention and concentration are reduced.  Unable to name objects, able to repeat phrases. Difficulty with executive function/visuospatial tasks. MOCA 6/30 Montreal Cognitive Assessment  05/09/2020  Visuospatial/ Executive (0/5) 1  Naming (0/3) 0  Attention: Read list of digits (0/2) 2  Attention: Read list of letters (0/1) 0  Attention: Serial 7 subtraction starting at 100 (0/3) 0  Language: Repeat phrase (0/2) 2  Language : Fluency (0/1) 0  Abstraction (0/2) 0  Delayed Recall (0/5) 0  Orientation (0/6) 1  Total 6  Adjusted Score (based on education) 6    Cranial nerves: CN I: not tested CN II: pupils equal, round and reactive to light, visual fields intact CN III, IV, VI:  full range of motion, no nystagmus, no ptosis CN V: facial sensation intact CN VII: upper and lower face symmetric CN VIII: hearing intact to conversation CN XI: sternocleidomastoid and trapezius muscles intact CN XII: tongue midline Bulk & Tone: normal, no fasciculations. Motor: 5/5 throughout with no pronator drift. Sensation: intact to light touch, cold, pin, vibration sense.  No extinction to double simultaneous stimulation.  Romberg test negative Deep Tendon Reflexes: +2 throughout, no ankle clonus Plantar responses: downgoing bilaterally Cerebellar: no incoordination on finger to nose testing Gait: narrow-based and steady, able to tandem walk adequately. Tremor: none   IMPRESSION: This is a pleasant 75 year old right-handed woman with a history of hypertension, hyperlipidemia, presenting for evaluation of memory loss. Her neurological exam is non-focal,  her MOCA score is significantly low at 6/30. Symptoms concerning for dementia, possibly Alzheimer's disease with behavioral changes (hallucinations at night). MRI brain without contrast will be ordered to assess for underlying structural abnormality. Neurocognitive testing will be done to further evaluate cognitive concerns. We discussed starting Donepezil, including side effects and expectations. It may help with behavioral changes as well. Start Donepezil 10mg  1/2 tablet daily for 2 weeks, then increase to 1 tablet daily. Continue close supervision. She does not drive. We discussed the importance of control of vascular risk factors, physical exercise, and brain stimulation exercises for brain health. Follow-up in 6 months, they know to call for any changes.    Thank you for allowing me to participate in the care of this patient. Please do not hesitate to call for any questions or concerns.   , M.D.  CC: Dr. [name]

## 2020-05-17 DIAGNOSIS — Z01812 Encounter for preprocedural laboratory examination: Secondary | ICD-10-CM | POA: Diagnosis not present

## 2020-05-20 DIAGNOSIS — D120 Benign neoplasm of cecum: Secondary | ICD-10-CM | POA: Diagnosis not present

## 2020-05-20 DIAGNOSIS — Z86010 Personal history of colonic polyps: Secondary | ICD-10-CM | POA: Diagnosis not present

## 2020-05-20 DIAGNOSIS — K5730 Diverticulosis of large intestine without perforation or abscess without bleeding: Secondary | ICD-10-CM | POA: Diagnosis not present

## 2020-05-20 DIAGNOSIS — Z8371 Family history of colonic polyps: Secondary | ICD-10-CM | POA: Diagnosis not present

## 2020-05-30 ENCOUNTER — Ambulatory Visit
Admission: RE | Admit: 2020-05-30 | Discharge: 2020-05-30 | Disposition: A | Discharge status: Home / Self Care | Payer: Medicare PPO | Source: Ambulatory Visit | Attending: Neurology | Admitting: Neurology

## 2020-05-30 ENCOUNTER — Other Ambulatory Visit: Payer: Self-pay

## 2020-05-30 DIAGNOSIS — F0390 Unspecified dementia without behavioral disturbance: Secondary | ICD-10-CM

## 2020-05-30 DIAGNOSIS — F03A0 Unspecified dementia, mild, without behavioral disturbance, psychotic disturbance, mood disturbance, and anxiety: Secondary | ICD-10-CM

## 2020-05-30 DIAGNOSIS — I6381 Other cerebral infarction due to occlusion or stenosis of small artery: Secondary | ICD-10-CM

## 2020-05-30 DIAGNOSIS — I739 Peripheral vascular disease, unspecified: Secondary | ICD-10-CM | POA: Diagnosis not present

## 2020-05-30 HISTORY — DX: Other cerebral infarction due to occlusion or stenosis of small artery: I63.81

## 2020-05-31 ENCOUNTER — Telehealth: Payer: Self-pay

## 2020-05-31 NOTE — Telephone Encounter (Signed)
-----   Message from Karen M Aquino, MD sent at 05/30/2020  4:39 PM EDT ----- Pls let significant other know the brain MRI did not show any evidence of tumor, stroke, or bleed. It showed age-related changes. Proceed with Neurocognitive testing as scheduled, thanks 

## 2020-05-31 NOTE — Telephone Encounter (Signed)
Pt called no answer left a voice mail for pt to call the office back for MRI results

## 2020-06-02 ENCOUNTER — Telehealth: Payer: Self-pay

## 2020-06-02 NOTE — Telephone Encounter (Signed)
Pt called per DPR left a voice mail stating brain MRI did not show any evidence of tumor, stroke, or bleed. It showed age-related changes. Proceed with Neurocognitive testing as scheduled. Pt has also seen results on her mychart

## 2020-06-02 NOTE — Telephone Encounter (Signed)
-----   Message from Van Clines, MD sent at 05/30/2020  4:39 PM EDT ----- Pls let significant other know the brain MRI did not show any evidence of tumor, stroke, or bleed. It showed age-related changes. Proceed with Neurocognitive testing as scheduled, thanks

## 2020-06-08 ENCOUNTER — Ambulatory Visit: Payer: Medicare PPO

## 2020-06-08 ENCOUNTER — Ambulatory Visit (INDEPENDENT_AMBULATORY_CARE_PROVIDER_SITE_OTHER): Payer: Medicare PPO | Admitting: Psychology

## 2020-06-08 ENCOUNTER — Other Ambulatory Visit: Payer: Self-pay

## 2020-06-08 ENCOUNTER — Encounter: Payer: Self-pay | Admitting: Psychology

## 2020-06-08 DIAGNOSIS — Z860100 Personal history of colon polyps, unspecified: Secondary | ICD-10-CM | POA: Insufficient documentation

## 2020-06-08 DIAGNOSIS — E785 Hyperlipidemia, unspecified: Secondary | ICD-10-CM | POA: Insufficient documentation

## 2020-06-08 DIAGNOSIS — E7800 Pure hypercholesterolemia, unspecified: Secondary | ICD-10-CM | POA: Insufficient documentation

## 2020-06-08 DIAGNOSIS — F0280 Dementia in other diseases classified elsewhere without behavioral disturbance: Secondary | ICD-10-CM | POA: Diagnosis not present

## 2020-06-08 DIAGNOSIS — H9319 Tinnitus, unspecified ear: Secondary | ICD-10-CM | POA: Insufficient documentation

## 2020-06-08 DIAGNOSIS — R4189 Other symptoms and signs involving cognitive functions and awareness: Secondary | ICD-10-CM

## 2020-06-08 DIAGNOSIS — I6381 Other cerebral infarction due to occlusion or stenosis of small artery: Secondary | ICD-10-CM | POA: Diagnosis not present

## 2020-06-08 DIAGNOSIS — G309 Alzheimer's disease, unspecified: Secondary | ICD-10-CM

## 2020-06-08 DIAGNOSIS — I10 Essential (primary) hypertension: Secondary | ICD-10-CM | POA: Insufficient documentation

## 2020-06-08 DIAGNOSIS — E2839 Other primary ovarian failure: Secondary | ICD-10-CM | POA: Insufficient documentation

## 2020-06-08 DIAGNOSIS — E559 Vitamin D deficiency, unspecified: Secondary | ICD-10-CM | POA: Insufficient documentation

## 2020-06-08 HISTORY — DX: Dementia in other diseases classified elsewhere, unspecified severity, without behavioral disturbance, psychotic disturbance, mood disturbance, and anxiety: F02.80

## 2020-06-08 NOTE — Progress Notes (Signed)
NEUROPSYCHOLOGICAL EVALUATION [place]. [facility] Department of Neurology  Date of Evaluation: June 08, 2020  Reason for Referral:   [name] is a 75 y.o. right-handed Caucasian female referred by Taylor Vasquez, M.D., to characterize her current cognitive functioning and assist with diagnostic clarity and treatment planning in the context of subjective cognitive decline.   Assessment and Plan:   Clinical Impression(s): Taylor Vasquez's pattern of performance is suggestive of diffuse and often severe cognitive impairment. Basic attentional abilities represented a relative strength. However, performances across all other cognitive domains exhibited mild to severe impairment relative to age-matched peers and premorbid intellectual estimations. Taylor Vasquez denied all cognitive concerns and difficulties with ADLs, suggesting that she is either actively minimizing concerns or has very limited insight into the extent of her current functioning. [name] noted that [name] is reliant on her for medication management and bill paying. Ms. Freels has also not driven for several years, at least partially due to cognitive concerns. 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  The etiology of dysfunction is difficult to determine given the diffuse nature of cognitive impairment. 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 Across memory measures, Taylor Vasquez was fully amnestic (i.e., 0% retrieval rates) and performed very poorly across recognition tasks. This suggests rapid forgetting and a memory storage deficit which is the hallmark characteristic of this condition. This, coupled with worse performance on semantic fluency relative to phonemic fluency, as well as impairments in confrontation naming,  executive functioning, and visuospatial abilities, are all consistent with this condition. As such, I do have significant concerns surrounding the presence of Alzheimer's disease at the present time. I cannot rule out Lewy body dementia and there is some report of potential visual hallucinations in her medical records. However, Taylor Vasquez denied these during the current interview and [name] did not contradict her. Overall, while possible, I believe this presentation seems less likely. I do not have concerns for frontotemporal dementia at the present time. Continued medical monitoring will be important moving forward.   Recommendations: Taylor Vasquez is already prescribed medication (i.e., donepezil/Aricept) commonly given to individuals with memory loss and concerns for Alzheimer's disease. This medication has been shown to slow functional decline in some individuals. It is important to highlight that no current treatment is able to stop or reverse cognitive decline. However, it will be important that Taylor Vasquez continues to take this medication as prescribed.   While performance across neurocognitive testing is admittedly not a strong predictor of an individual's safety operating a motor vehicle, I very much agree with Ms. Kyer fully abstaining from all driving behaviors given the extent of cognitive dysfunction.  [name] is unlikely to regain any independent living skills lost. Therefore, management of these abilities will need to continually be performed by [name] or another qualified individual. She will likely benefit from the establishment and maintenance of a routine in order to maximize her functional abilities over time.  It will be important for Taylor Vasquez to have another person with her when in situations where she may need to process information, weigh the pros and cons of different options, and make decisions, in order to ensure that she fully understands and recalls all information to be considered.  If  not already done, Taylor Vasquez and her family should discuss her wishes  regarding durable power of attorney and medical decision making, so that she can have input into these choices. Additionally, they may wish to discuss future plans for caretaking and seek out community options for in home/residential care should they become necessary.  All important information to remember should be provided in written format. This should be placed in a highly visible and commonly frequented area of her residence to try to help increase recall.  Review of Records:   Taylor Vasquez was seen by [facility] Taylor Vasquez, M.D.) on 05/09/2020 for an evaluation of memory loss. [name] was present to provide history. Taylor Vasquez reported feeling as though her memory is still okay but probably not as good as it used to be since she is older. [name] has known [name] for 40 years and started noticing changes around 10 years ago. [name] initially noticed directional changes where [name] would be driving and on the way back home, [name] would "100% pick the wrong way." They moved back to [facility] in 2015; [name] had stopped driving just before that. [name] had previously worked as her Geophysicist/field seismologist and had always done her medications, otherwise Taylor Vasquez would forget to take them. [name] took over bill paying and Landscape architect in 2014. Taylor Vasquez is independent with dressing and bathing and no hygiene concerns were reported. Her mood is pretty calm and they denied paranoia or agitation. Recently, Taylor Vasquez has started seeing people in the backyard every night saying they are walking down the hill (that is not there). Sleep was described as good and no wandering behaviors were noted. She denied any headaches, dizziness, diplopia, dysarthria/dysphagia, neck/back pain, bowel/bladder dysfunction, focal numbness/tingling/weakness, anosmia, or tremors. Performance on a brief cognitive screening instrument (MOCA) was 6/30. Ultimately,  Ms. Obeirne was referred for a comprehensive neuropsychological evaluation to characterize her cognitive abilities and to assist with diagnostic clarity and treatment planning.   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   Past Medical History:  Diagnosis Date  . Decreased estrogen level   . Essential hypertension   . History of colonic polyps   . Hyperlipidemia   . 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 Pure hypercholesterolemia   . Tinnitus   . Vitamin D deficiency     No past surgical history on file.   Current Outpatient Medications:  .  atorvastatin (LIPITOR) 10 MG tablet, 1 tablet, Disp: , Rfl:  .  Calcium-Vitamin D-Vitamin K (VIACTIV CALCIUM PLUS D) 650-12.5-40 MG-MCG-MCG CHEW, 1 tablet, Disp: , Rfl:  .  donepezil (ARICEPT) 10 MG tablet, Take 1/2 tablet daily for 2 weeks, then increase to 1 tablet daily, Disp: 30 tablet, Rfl: 11 .  lisinopril (ZESTRIL) 20 MG tablet, 1 tablet, Disp: , Rfl:  .  Multiple Vitamins-Minerals (CENTRUM SILVER) tablet, 1 tablet, Disp: , Rfl:   Clinical Interview:   The following information was obtained during a clinical interview with Ms. Zmuda and [name] prior to cognitive testing.  Cognitive Symptoms: Decreased short-term memory: Denied. Ms. Hammer attributed mild memory symptoms to her age, stating "I think I'm okay." [name] reported more significant concerns, particularly focusing on Ms. Frye having trouble remembering where things are located. Memory deficits were said to have been present for the past six or so years and have gradually worsened over time.  Decreased long-term memory: Denied. Decreased attention/concentration: Denied. Reduced processing speed: Denied. [name] reported  occasional concerns surrounding diminished processing speed.  Difficulties with executive functions: Denied. [name] described increased  difficulties surrounding organization, noting that Ms. Bradburn seems more "erratic" and disorganized than what is typical for her. Trouble with indecisiveness and impulsivity were consistently denied. Overt personality changes were also denied.  Difficulties with emotion regulation: Denied. Difficulties with receptive language: Denied assuming she can hear the source of the sound adequately.  Difficulties with word finding: Denied. Decreased visuoperceptual ability: Denied.  Difficulties completing ADLs: Endorsed. When asked, Ms. Minion denied difficulties managing medications. However, [name] added that she will manage/organize Ms. Friday's medications and administer them to her. [name] has been managing bill paying and personal finances since 2014. Ms. Crain also stopped driving due to navigational concerns around 77.   Additional Medical History: History of traumatic brain injury/concussion: Denied. History of stroke: Denied. History of seizure activity: Denied. History of known exposure to toxins: Denied. Symptoms of chronic pain: Denied. Experience of frequent headaches/migraines: Denied. Frequent instances of dizziness/vertigo: Denied.  Sensory changes: Ms. Talbot was vague when describing hearing difficulties, stating that they "come and go." [name] reported that she has undergone a formal hearing evaluation and hearing aids were recommended. However, Ms. Zachow was resistant to utilizing these devices. Ms. Casimir wears glasses with positive effect. Other sensory changes/difficulties (e.g., taste or smell) were denied.  Balance/coordination difficulties: Denied. [name] noted that Ms. Claire has seemed more careful when ambulating lately. However, they both denied any recent falls or outright balance concerns.  Other motor difficulties: Denied.  Sleep History: Estimated hours obtained each night: 8-9 hours. Difficulties falling asleep: Denied. Difficulties staying asleep: Denied. Feels rested and  refreshed upon awakening: Endorsed.  History of snoring: Endorsed. History of waking up gasping for air: Denied. Witnessed breath cessation while asleep: Denied.  History of vivid dreaming: Denied. Excessive movement while asleep: Denied. Instances of acting out her dreams: Denied.  Psychiatric/Behavioral Health History: Depression: Ms. Lias deferred to [name] when asked this question. [name] described her mood as positive and "even-keel" for the most part. They both denied previous mental health concerns or diagnoses to their knowledge. Current or remote suicidal ideation, intent, or plan was denied.  Anxiety: Denied. Mania: Denied. Trauma History: Denied. Visual/auditory hallucinations: Denied. However, per Dr. Rosalyn Gess most recent notes, [name] noted at least one instance where Ms. Culmer reported seeing people in the backyard in the evening saying they were walking down a hill (which is not there). Delusional thoughts: Denied.  Tobacco: Denied. Alcohol: Ms. Hearty denied current alcohol consumption. However, [name] reminded her that they will generally consume one glass of wine per night. A history of problematic alcohol abuse or dependence was denied.  Recreational drugs: Denied. Caffeine: 1-2 cups of coffee in the morning.   Family History: Problem Relation Age of Onset  . Dementia Mother        Unspecified type   This information was confirmed by Ms. Neto.  Academic/Vocational History: Highest level of educational attainment: 18 years. Ms. Aro earned a Bachelor's degree in health and physical education as well as a Master's degree in administration. She described herself as a good (A/B) student in academic settings. No relative weaknesses were identified.   History of developmental delay: Denied. History of grade repetition: Denied. Enrollment in special education courses: Denied. History of LD/ADHD: Denied.  Employment: Retired. She previously served as the Psychologist, occupational at [employer].   Evaluation Results:   Behavioral Observations: Ms. Redel was accompanied by [name], arrived to her  appointment on time, and was appropriately dressed and groomed. She appeared alert and oriented. Observed gait and station were within normal limits. Gross motor functioning appeared intact upon informal observation and no abnormal movements (e.g., tremors) were noted. Her affect was generally relaxed and positive, but did range appropriately given the subject being discussed during the clinical interview or the task at hand during testing procedures. Spontaneous speech was fluent and word finding difficulties were not observed during the clinical interview. Thought processes were coherent, organized, and normal in content. Insight into her cognitive difficulties appeared very limited in that she largely denied all cognitive concerns despite objective testing revealing significant impairments across numerous cognitive domains. She also provided inaccurate information during the interview and had to be corrected by [name] on several occasions. During testing, sustained attention was appropriate. Task engagement was adequate and she persisted when challenged. One test (TMT B) was discontinued due to her inability to comprehend the task. She was noted to repeatedly ask if she was performing tasks correctly. Set loss errors were observed throughout various tasks. Mood-related questionnaires were required to be read aloud as Ms. Toomey was unable to stay within appropriate lines and would often place her responses in areas designated for other questions. Overall, Ms. Giambra was cooperative with the clinical interview and subsequent testing procedures.   Adequacy of Effort: The validity of neuropsychological testing is limited by the extent to which the individual being tested may be assumed to have exerted adequate effort during testing. Ms. Nordlund expressed her intention to perform to  the best of her abilities and exhibited adequate task engagement and persistence. Scores across stand-alone and embedded performance validity measures were variable but generally within expectation. One below expectation performance is likely due to true memory dysfunction rather than poor engagement or attempts to perform poorly. As such, the results of the current evaluation are believed to be a valid representation of Ms. Skerritt's current cognitive functioning.  Test Results: Ms. Baine was poorly oriented at the time of the current evaluation. She was unable to state her age, simply stating "I'm old." When her age was told to her, she appeared somewhat surprised to hear that she was 89. She also incorrectly stated her current address, stated the current year as 2021, and was unable to state the current month, date, day of the week, time, location, or reason for the current evaluation (i.e., "someone told me to come").  Intellectual abilities based upon educational and vocational attainment were estimated to be in the average to above average range. Premorbid abilities were estimated to be within the average range based upon a single-word reading test.   Processing speed was exceptionally low to below average. Basic attention was above average to well above average. More complex attention (e.g., working memory) was exceptionally low to below average. Executive functioning was exceptionally low. She also performed in the exceptionally low normative range on a task assessing safety and judgment. While some responses were overly vague, she appeared confused by certain questions and was unable to give appropriate responses to others. For example, when asked why one should not unplug electrical appliances while their hands are wet, she responded with that it could "blow up the house." She also failed to describe any sort of immediate or emergency response when asked what she should do if she took too much of a  prescription medication.   Assessed receptive language abilities were exceptionally low. She exhibited difficulties across all aspects of this task, including trouble with  sequencing, understanding complex sentence structure, understanding conceptual based information, and trouble following multi-step commands. Assessed expressive language was mildly variable. Phonemic fluency was well below average to below average, semantic fluency was exceptionally low to well below average, and confrontation naming was exceptionally low.     Assessed visuospatial/visuoconstructional abilities were exceptionally low. When drawing her clock, the numbers 1 through 11 were placed in generally appropriate positions. However, the number 12 was placed in the center of the clock. When asked to place the clock hands, she drew a 0 next to the number 1 and provided no other response. Points were lost on her copy of a complex figure due to very poor attention to detail. Specifically, she omitted several large aspects of the figure, both on the left and right extremes of this image. This was despite the psychometrist making her double check that everything was included. Spatial properties of what was drawn were generally appropriate.   Learning (i.e., encoding) of novel verbal information was exceptionally low to well below average. Spontaneous delayed recall (i.e., retrieval) of previously learned information was exceptionally low. Retention rates were 0% across a story learning task, 0% across a list learning task, and 0% across a figure drawing task. Performance across recognition tasks was below average across a story-based task; however, she was noted to have benefited from guessing many correct responses. Her performance was exceptionally low across two other assessments, suggesting limited evidence for information consolidation.   Results of emotional screening instruments suggested that recent symptoms of generalized anxiety  were in the minimal range, while symptoms of depression were within normal limits. A screening instrument assessing recent sleep quality suggested the presence of minimal sleep dysfunction.  Tables of Scores:   Note: This summary of test scores accompanies the interpretive report and should not be considered in isolation without reference to the appropriate sections in the text. Descriptors are based on appropriate normative data and may be adjusted based on clinical judgment. The terms "impaired" and "within normal limits (WNL)" are used when a more specific level of functioning cannot be determined.       Effort Testing:   DESCRIPTOR       Dot Counting Test: --- --- Within Expectation  RBANS Effort Index: --- --- Below Expectation  WAIS-IV Reliable Digit Span: --- --- Within Expectation  D-KEFS Color Word Effort Index: --- --- Within Expectation       Orientation:      Raw Score Percentile   NAB Orientation, Form 1 15/29 --- ---       Cognitive Screening:           Raw Score Percentile   SLUMS: 8/30 --- ---       RBANS, Form A: Standard Score/ Scaled Score Percentile   Total Score 50 <1 Exceptionally Low  Immediate Memory 57 <1 Exceptionally Low    List Learning 2 <1 Exceptionally Low    Story Memory 4 2 Well Below Average  Visuospatial/Constructional 56 <1 Exceptionally Low    Figure Copy 3 1 Exceptionally Low    Line Orientation 5/20 <2 Exceptionally Low  Language 40 <1 Exceptionally Low    Picture Naming 2/10 <2 Exceptionally Low    Semantic Fluency 1 <1 Exceptionally Low  Attention 88 21 Below Average    Digit Span 15 95 Well Above Average    Coding 1 <1 Exceptionally Low  Delayed Memory 40 <1 Exceptionally Low    List Recall 0/10 <2 Exceptionally Low    List Recognition  10/20 <2 Exceptionally Low    Story Recall 1 <1 Exceptionally Low    Story Recognition 9/12 16-26 Below Average    Figure Recall 1 <1 Exceptionally Low    Figure Recognition 1/8 1 Exceptionally Low        Intellectual Functioning:           Standard Score Percentile   Test of Premorbid Functioning: 99 47 Average       Attention/Executive Function:          Trail Making Test (TMT): Raw Score (T Score) Percentile     Part A 150 secs.,  0 errors (15) <1 Exceptionally Low    Part B Discontinued --- Impaired         Scaled Score Percentile   WAIS-IV Digit Span: 6 9 Below Average    Forward 12 75 Above Average    Backward 6 9 Below Average    Sequencing 2 <1 Exceptionally Low        Scaled Score Percentile   WAIS-IV Similarities: 3 1 Exceptionally Low       D-KEFS Color-Word Interference Test: Raw Score (Scaled Score) Percentile     Color Naming 46 secs. (5) 5 Well Below Average    Word Reading 34 secs. (6) 9 Below Average    Inhibition 81 secs. (9) 37 Average      Total Errors 32 errors (1) <1 Exceptionally Low    Inhibition/Switching 52 secs. (14) 91 Above Average      Total Errors 26 errors (1) <1 Exceptionally Low       D-KEFS Verbal Fluency Test: Raw Score (Scaled Score) Percentile     Letter Total Correct 26 (7) 16 Below Average    Category Total Correct 17 (4) 2 Well Below Average    Category Switching Total Correct 3 (1) <1 Exceptionally Low    Category Switching Accuracy 1 (1) <1 Exceptionally Low      Total Set Loss Errors 5 (6) 9 Below Average      Total Repetition Errors 3 (10) 50 Average       NAB Executive Functions Module, Form 1: T Score Percentile     Judgment 19 <1 Exceptionally Low       Language:          Verbal Fluency Test: Raw Score (T Score) Percentile     Phonemic Fluency (FAS) 26 (34) 5 Well Below Average    Animal Fluency 7 (13) <1 Exceptionally Low        NAB Language Module, Form 1: T Score Percentile     Auditory Comprehension 19 <1 Exceptionally Low    Naming 9/31 (19) <1 Exceptionally Low       Visuospatial/Visuoconstruction:      Raw Score Percentile   Clock Drawing: 4/10 --- Impaired       Mood and Personality:      Raw Score  Percentile   Geriatric Depression Scale: 0 --- Within Normal Limits  Geriatric Anxiety Scale: 2 --- Minimal    Somatic 2 --- Minimal    Cognitive 0 --- Minimal    Affective 0 --- Minimal       Additional Questionnaires:      Raw Score Percentile   PROMIS Sleep Disturbance Questionnaire: 8 --- None to Slight   Informed Consent and Coding/Compliance:   The current evaluation represents a clinical evaluation for the purposes previously outlined by the referral source and is in no way reflective of a forensic evaluation.  Ms. Tilda Burrowgee was provided with a verbal description of the nature and purpose of the present neuropsychological evaluation. Also reviewed were the foreseeable risks and/or discomforts and benefits of the procedure, limits of confidentiality, and mandatory reporting requirements of this provider. The patient was given the opportunity to ask questions and receive answers about the evaluation. Oral consent to participate was provided by the patient.   This evaluation was conducted by Newman NickelsZachary C. Sinahi Knights, Ph.D., licensed clinical neuropsychologist. Ms. Tilda Burrowgee completed a clinical interview with Dr. Milbert CoulterMerz, billed as one unit [phone], and 135 minutes of cognitive testing and scoring, billed as one unit [phone] and four additional units [id number]. Psychometrist Shan LevansKim Shuttleworth, B.S., assisted Dr. Milbert CoulterMerz with test administration and scoring procedures. As a separate and discrete service, Dr. Milbert CoulterMerz spent a total of 110 minutes in interpretation and report writing billed as one unit [phone] and one unit [phone].

## 2020-06-08 NOTE — Progress Notes (Signed)
   Psychometrician Note   Cognitive testing was administered to Taylor Vasquez by Shan Levans, B.S. (psychometrist) under the supervision of Dr. Newman Nickels, Ph.D., licensed psychologist on 06/08/2020. [name] did not appear overtly distressed by the testing session per behavioral observation or responses across self-report questionnaires. Rest breaks were offered.    The battery of tests administered was selected by Dr. Newman Nickels, Ph.D. with consideration to [name]'s current level of functioning, the nature of her symptoms, emotional and behavioral responses during interview, level of literacy, observed level of motivation/effort, and the nature of the referral question. This battery was communicated to the psychometrist. Communication between Dr. Newman Nickels, Ph.D. and the psychometrist was ongoing throughout the evaluation and Dr. Newman Nickels, Ph.D. was immediately accessible at all times. Dr. Newman Nickels, Ph.D. provided supervision to the psychometrist on the date of this service to the extent necessary to assure the quality of all services provided.    Taylor Vasquez will return within approximately 1-2 weeks for an interactive feedback session with Dr. Milbert Coulter at which time her test performances, clinical impressions, and treatment recommendations will be reviewed in detail. [name] understands she can contact our office should she require our assistance before this time.  A total of 135 minutes of billable time were spent face-to-face with [name] by the psychometrist. This includes both test administration and scoring time. Billing for these services is reflected in the clinical report generated by Dr. Newman Nickels, Ph.D.  This note reflects time spent with the psychometrician and does not include test scores or any clinical interpretations made by Dr. Milbert Coulter. The full report will follow in a separate note.

## 2020-06-15 ENCOUNTER — Other Ambulatory Visit: Payer: Self-pay

## 2020-06-15 ENCOUNTER — Ambulatory Visit (INDEPENDENT_AMBULATORY_CARE_PROVIDER_SITE_OTHER): Payer: Medicare PPO | Admitting: Psychology

## 2020-06-15 DIAGNOSIS — F0280 Dementia in other diseases classified elsewhere without behavioral disturbance: Secondary | ICD-10-CM

## 2020-06-15 DIAGNOSIS — G309 Alzheimer's disease, unspecified: Secondary | ICD-10-CM | POA: Diagnosis not present

## 2020-06-15 NOTE — Progress Notes (Signed)
   Neuropsychology Feedback Session [name]. [facility] Department of Neurology  Reason for Referral:   Taylor Vasquez a 75 y.o. right-handed Caucasian female referred by [name], M.D.,to characterize hercurrent cognitive functioning and assist with diagnostic clarity and treatment planning in the context of subjective cognitive decline.   Feedback:   [name] completed a comprehensive neuropsychological evaluation on 06/08/2020. Please refer to that encounter for the full report and recommendations. Briefly, results suggested diffuse and often severe cognitive impairment. Basic attentional abilities represented a relative strength. However, performances across all other cognitive domains exhibited mild to severe impairment relative to age-matched peers and premorbid intellectual estimations. The etiology of dysfunction is difficult to determine given the diffuse nature of cognitive impairment. 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 Across memory measures, [name] was fully amnestic (i.e., 0% retrieval rates) and performed very poorly across recognition tasks. This suggests rapid forgetting and a memory storage deficit which is the hallmark characteristic of this condition. This, coupled with worse performance on semantic fluency relative to phonemic fluency, as well as impairments in confrontation naming, executive functioning, and visuospatial abilities, are all consistent with this condition.   [name] was accompanied by [name] during the current feedback session. Content of the current session focused on the results of her neuropsychological evaluation. [name] and [name] were given the opportunity to ask questions and their questions were answered. They were encouraged to reach out should additional questions arise. A copy of her report was provided at the conclusion of the visit.      17 minutes were  spent conducting the current feedback session with [name], billed as one unit [phone].

## 2020-06-22 ENCOUNTER — Other Ambulatory Visit: Payer: Self-pay

## 2020-06-22 ENCOUNTER — Ambulatory Visit: Payer: Medicare PPO | Attending: Internal Medicine

## 2020-06-22 DIAGNOSIS — Z23 Encounter for immunization: Secondary | ICD-10-CM

## 2020-06-22 NOTE — Progress Notes (Signed)
   Covid-19 Vaccination Clinic  Name:  Taylor Vasquez    MRN: [id number] DOB: [birth year]  06/22/2020  [name] was observed post Covid-19 immunization for 15 minutes without incident. She was provided with Vaccine Information Sheet and instruction to access the V-Safe system.   [name] was instructed to call 911 with any severe reactions post vaccine: Marland Kitchen Difficulty breathing  . Swelling of face and throat  . A fast heartbeat  . A bad rash all over body  . Dizziness and weakness   Immunizations Administered    Name Date Dose VIS Date Route   PFIZER Comrnaty(Gray TOP) Covid-19 Vaccine 06/22/2020 12:19 PM 0.3 mL 02/25/2020 Intramuscular   Manufacturer: [employer], [employer]   Lot: [id number]   NDC: [phone]

## 2020-06-30 ENCOUNTER — Other Ambulatory Visit (HOSPITAL_BASED_OUTPATIENT_CLINIC_OR_DEPARTMENT_OTHER): Payer: Self-pay

## 2020-06-30 MED ORDER — COVID-19 MRNA VACCINE (PFIZER) 30 MCG/0.3ML IM SUSP
INTRAMUSCULAR | 0 refills | Status: DC
  Filled 2020-06-30: qty 0.3, 1d supply, fill #0

## 2020-11-07 DIAGNOSIS — H5203 Hypermetropia, bilateral: Secondary | ICD-10-CM | POA: Diagnosis not present

## 2020-11-07 DIAGNOSIS — H524 Presbyopia: Secondary | ICD-10-CM | POA: Diagnosis not present

## 2020-11-07 DIAGNOSIS — Z9841 Cataract extraction status, right eye: Secondary | ICD-10-CM | POA: Diagnosis not present

## 2020-11-07 DIAGNOSIS — H2512 Age-related nuclear cataract, left eye: Secondary | ICD-10-CM | POA: Diagnosis not present

## 2020-11-14 DIAGNOSIS — G309 Alzheimer's disease, unspecified: Secondary | ICD-10-CM | POA: Diagnosis not present

## 2020-11-14 DIAGNOSIS — E7800 Pure hypercholesterolemia, unspecified: Secondary | ICD-10-CM | POA: Diagnosis not present

## 2020-11-14 DIAGNOSIS — F0280 Dementia in other diseases classified elsewhere without behavioral disturbance: Secondary | ICD-10-CM | POA: Diagnosis not present

## 2020-11-14 DIAGNOSIS — I10 Essential (primary) hypertension: Secondary | ICD-10-CM | POA: Diagnosis not present

## 2020-11-14 DIAGNOSIS — Z0000 Encounter for general adult medical examination without abnormal findings: Secondary | ICD-10-CM | POA: Diagnosis not present

## 2020-11-23 ENCOUNTER — Ambulatory Visit: Payer: Medicare PPO

## 2020-12-12 ENCOUNTER — Other Ambulatory Visit: Payer: Self-pay

## 2020-12-12 ENCOUNTER — Other Ambulatory Visit: Payer: Self-pay | Admitting: Family Medicine

## 2020-12-12 DIAGNOSIS — Z1231 Encounter for screening mammogram for malignant neoplasm of breast: Secondary | ICD-10-CM

## 2021-01-04 ENCOUNTER — Other Ambulatory Visit: Payer: Self-pay

## 2021-01-04 ENCOUNTER — Encounter: Payer: Self-pay | Admitting: Neurology

## 2021-01-04 ENCOUNTER — Ambulatory Visit: Payer: Medicare PPO | Admitting: Neurology

## 2021-01-04 VITALS — BP 143/75 | HR 69 | Ht 68.0 in | Wt 155.6 lb

## 2021-01-04 DIAGNOSIS — G309 Alzheimer's disease, unspecified: Secondary | ICD-10-CM | POA: Diagnosis not present

## 2021-01-04 DIAGNOSIS — F0280 Dementia in other diseases classified elsewhere without behavioral disturbance: Secondary | ICD-10-CM | POA: Diagnosis not present

## 2021-01-04 MED ORDER — DONEPEZIL HCL 10 MG PO TABS: ORAL_TABLET | ORAL | 3 refills | Status: DC

## 2021-01-04 NOTE — Progress Notes (Signed)
NEUROLOGY FOLLOW UP OFFICE NOTE  Taylor Vasquez [id number] February 19, 1946  HISTORY OF PRESENT ILLNESS: I had the pleasure of seeing Taylor Vasquez in follow-up in the neurology clinic on 01/04/2021.  The patient was last seen 8 months ago for dementia. She is again accompanied by [name] who helps supplement the history today.  Records and images were personally reviewed where available.  I personally reviewed MRI brain without contrast done 05/2020 which did not show any acute changes. There was moderate diffuse volume loss and mild chronic microvascular disease. Neuropsychological evaluation in 05/2020 showed diffuse and severe cognitive impairment with a diagnosis of Major Neurocognitive Disorder, etiology likely Alzheimer's disease. LBD could not be ruled out however felt less likely. She is on Donepezil 10mg  daily without significant side effects.  She reports feeling fine and doing well. states symptoms have overall been stable. Sometimes she does "peculiar things." She was having some personality changes that improved with Citalopram. The visual hallucinations where she sees people in their backyard are still there but not as bad. They close the windows and she is fine. [name], medications, meals. [employer] picks her clothes, she is able to bathe and dress herself. Sleep is good. She denies any headaches, dizziness, focal numbness/tingling/weakness, no falls. She has a history of diverticulitis with some diarrhea, that may be a little worse since Donepezil was added, no incontinence.    History on Initial Assessment 05/09/2020: This is a pleasant 75 year old right-handed woman with a history of hypertension, hyperlipidemia, presenting for evaluation of memory loss. Her partner 65 is present to provide history. She feels her memory is still okay, probably not as good as it used to be since she is older. [name] has known Taylor Vasquez for 40 years, she started noticing changes around  10 years ago. [name] initially noticed directional changes, [name] would be driving and on the way back home, she would "100% pick the wrong way." They moved back to [facility] in 2015, she had stopped driving just before that. She was not getting lost. 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 Geophysicist/field seismologist took over bills in 2014. She does not cook much, 2015 does most of the cooking. She has not left the stove on. She denies misplacing things frequently. She is independent with dressing and bathing, no hygiene concerns. Mood is pretty calm, no paranoia or agitation. Recently she has started seeing people in the backyard every night, saying they are walking down the hill (that is not there). Sleep is good, no wandering behaviors. She denies any headaches, dizziness, diplopia, dysarthria/dysphagia, neck/back pain, bowel/bladder dysfunction, focal numbness/tingling/weakness, anosmia, or tremors. She is a retired [name] for [employer]. Her mother had dementia. No significant head injuries. They drink a glass of wine every night.    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 0.3 mL 0   donepezil (ARICEPT) 10 MG tablet Take 1/2 tablet daily for 2 weeks, then  increase to 1 tablet daily 30 tablet 11   lisinopril (ZESTRIL) 20 MG tablet 1 tablet     Multiple Vitamins-Minerals (CENTRUM SILVER) tablet 1 tablet     No current facility-administered medications on file prior to visit.    ALLERGIES: No Known Allergies  FAMILY HISTORY: Family History  Problem Relation Age of Onset   Dementia Mother        Unspecified type    SOCIAL HISTORY: Social History   Socioeconomic History   Marital status: Married    Spouse name: Not on file   Number of children: Not on file   Years of education: 79   Highest education level: Master's degree (e.g., MA, MS, MEng, MEd, MSW, MBA)  Occupational History   Occupation: Retired  Tobacco Use   Smoking status: Never   Smokeless tobacco: Never  Vaping Use   Vaping Use: Never used  Substance and Sexual Activity   Alcohol use: Yes    Alcohol/week: 7.0 standard drinks    Types: 7 Glasses of wine per week    Comment: 1 glass of wine/night   Drug use: No   Sexual activity: Not on file  Other Topics Concern   Not on file  Social History Narrative   Right handed    Lives with friend   Social Determinants of Health   Financial Resource Strain: Not on file  Food Insecurity: Not on file  Transportation Needs: Not on file  Physical Activity: Not on file  Stress: Not on file  Social Connections: Not on file  Intimate Partner Violence: Not on file     PHYSICAL EXAM: Vitals:   01/04/21 1127  BP: (!) 143/75  Pulse: 69  SpO2: 96%   General: No acute distress Head:  Normocephalic/atraumatic Skin/Extremities: No rash, no edema Neurological Exam: alert and awake. No aphasia or dysarthria. Fund of knowledge is reduced.  Recent and remote memory are impaired. Attention and concentration are normal.   Cranial nerves: Pupils equal, round. Extraocular movements intact with no nystagmus. Visual fields full.  No facial asymmetry.  Motor: Bulk and tone normal, muscle strength 5/5 throughout with no pronator  drift.   Finger to nose testing intact.  Gait narrow-based and steady, able to tandem walk adequately.  Romberg negative.   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 MRI brain showed moderate diffuse atrophy and mild chronic microvascular disease. She is overall stable on Donepezil 10mg  daily. Mood improved with Citalopram prescribed by Dr. . Continue close supervision. Continue control of vascular risk factors, physical exercise, brain stimulation exercises, MIND diet for overall brain health. We discussed results of tests and diagnosis, additional information provided for them as well. Follow-up with Memory Disorders PA Chanetta Marshall in 6 months, they know to call for any changes.   Thank you for allowing me to participate in her care.  Please do not hesitate to call for any questions or concerns.   Marlowe Kays, M.D.   CC: Dr. [name]

## 2021-01-04 NOTE — Patient Instructions (Signed)
Good to see you!  Continue Donepezil 10mg  daily  2. Follow-up in 6 months, call for any changes   FALL PRECAUTIONS: Be cautious when walking. Scan the area for obstacles that may increase the risk of trips and falls. When getting up in the mornings, sit up at the edge of the bed for a few minutes before getting out of bed. Consider elevating the bed at the head end to avoid drop of blood pressure when getting up. Walk always in a well-lit room (use night lights in the walls). Avoid area rugs or power cords from appliances in the middle of the walkways. Use a walker or a cane if necessary and consider physical therapy for balance exercise. Get your eyesight checked regularly.  HOME SAFETY: Consider the safety of the kitchen when operating appliances like stoves, microwave oven, and blender. Consider having supervision and share cooking responsibilities until no longer able to participate in those. Accidents with firearms and other hazards in the house should be identified and addressed as well.  ABILITY TO BE LEFT ALONE: If patient is unable to contact 911 operator, consider using LifeLine, or when the need is there, arrange for someone to stay with patients. Smoking is a fire hazard, consider supervision or cessation. Risk of wandering should be assessed by caregiver and if detected at any point, supervision and safe proof recommendations should be instituted.   RECOMMENDATIONS FOR ALL PATIENTS WITH MEMORY PROBLEMS: 1. Continue to exercise (Recommend 30 minutes of walking everyday, or 3 hours every week) 2. Increase social interactions - continue going to [place] and enjoy social gatherings with friends and family 3. Eat healthy, avoid fried foods and eat more fruits and vegetables 4. Maintain adequate blood pressure, blood sugar, and blood cholesterol level. Reducing the risk of stroke and cardiovascular disease also helps promoting better memory. 5. Avoid stressful situations. Live a simple life  and avoid aggravations. Organize your time and prepare for the next day in anticipation. 6. Sleep well, avoid any interruptions of sleep and avoid any distractions in the bedroom that may interfere with adequate sleep quality 7. Avoid sugar, avoid sweets as there is a strong link between excessive sugar intake, diabetes, and cognitive impairment The Mediterranean diet has been shown to help patients reduce the risk of progressive memory disorders and reduces cardiovascular risk. This includes eating fish, eat fruits and green leafy vegetables, nuts like almonds and hazelnuts, walnuts, and also use olive oil. Avoid fast foods and fried foods as much as possible. Avoid sweets and sugar as sugar use has been linked to worsening of memory function.  There is always a concern of gradual progression of memory problems. If this is the case, then we may need to adjust level of care according to patient needs. Support, both to the patient and caregiver, should then be put into place.

## 2021-01-17 ENCOUNTER — Other Ambulatory Visit: Payer: Self-pay

## 2021-01-17 ENCOUNTER — Ambulatory Visit
Admission: RE | Admit: 2021-01-17 | Discharge: 2021-01-17 | Disposition: A | Discharge status: Home / Self Care | Payer: Medicare PPO | Source: Ambulatory Visit | Attending: Family Medicine | Admitting: Family Medicine

## 2021-01-17 DIAGNOSIS — Z1231 Encounter for screening mammogram for malignant neoplasm of breast: Secondary | ICD-10-CM

## 2021-02-14 DIAGNOSIS — G309 Alzheimer's disease, unspecified: Secondary | ICD-10-CM | POA: Diagnosis not present

## 2021-02-14 DIAGNOSIS — I10 Essential (primary) hypertension: Secondary | ICD-10-CM | POA: Diagnosis not present

## 2021-03-21 ENCOUNTER — Telehealth: Payer: Self-pay | Admitting: Neurology

## 2021-03-21 NOTE — Telephone Encounter (Signed)
Pt's spouse called in stating the pharmacy is still filling the pt's donepezil as if she is only taking a half a tablet each day, so she is running out. She needs a new prescription sent in with the current dosage of 1 tablet each day. [facility] on [address]

## 2021-03-21 NOTE — Telephone Encounter (Signed)
Pt spouse called an informed that the pharmacy was refilling the wrong script the new one has been on file from October, the pharmacy DC the old script and is going to fill the right one.

## 2021-04-26 ENCOUNTER — Other Ambulatory Visit: Payer: Self-pay | Admitting: Neurology

## 2021-04-28 ENCOUNTER — Other Ambulatory Visit: Payer: Self-pay | Admitting: Neurology

## 2021-06-15 DIAGNOSIS — D485 Neoplasm of uncertain behavior of skin: Secondary | ICD-10-CM | POA: Diagnosis not present

## 2021-06-15 DIAGNOSIS — L820 Inflamed seborrheic keratosis: Secondary | ICD-10-CM | POA: Diagnosis not present

## 2021-06-15 DIAGNOSIS — Z85828 Personal history of other malignant neoplasm of skin: Secondary | ICD-10-CM | POA: Diagnosis not present

## 2021-07-05 ENCOUNTER — Encounter: Payer: Self-pay | Admitting: Physician Assistant

## 2021-07-05 ENCOUNTER — Ambulatory Visit: Payer: Medicare PPO | Admitting: Physician Assistant

## 2021-07-05 ENCOUNTER — Other Ambulatory Visit: Payer: Self-pay | Admitting: Physician Assistant

## 2021-07-05 VITALS — BP 145/91 | HR 70 | Resp 18 | Ht 60.0 in | Wt 158.0 lb

## 2021-07-05 DIAGNOSIS — G309 Alzheimer's disease, unspecified: Secondary | ICD-10-CM

## 2021-07-05 DIAGNOSIS — F0280 Dementia in other diseases classified elsewhere without behavioral disturbance: Secondary | ICD-10-CM | POA: Diagnosis not present

## 2021-07-05 MED ORDER — DONEPEZIL HCL 10 MG PO TABS: ORAL_TABLET | ORAL | 0 refills | Status: DC

## 2021-07-05 NOTE — Progress Notes (Signed)
? ?NEUROLOGY FOLLOW UP OFFICE NOTE ? ?Taylor Vasquez ?[id number] ?1946-03-15 ? ?HISTORY OF PRESENT ILLNESS: ? ?I had the pleasure of seeing Taylor Vasquez in follow-up in the neurology clinic on  4/75/23 . She was last seen at our clinic on 01/04/2021.  She is again accompanied by her partner Taylor Vasquez who helps supplement the history today.  Records and images were personally reviewed where available.  I personally reviewed MRI brain without contrast done 05/2020 which did not show any acute changes. There was moderate diffuse volume loss and mild chronic microvascular disease. Neuropsychological evaluation in 05/2020 showed diffuse and severe cognitive impairment with a diagnosis of Major Neurocognitive Disorder, etiology likely Alzheimer's disease. LBD could not be ruled out however felt less likely. She is on Donepezil 10 mg daily without significant side effects. ?She reports feeling fine and doing well. Taylor Vasquez states symptoms have overall been stable. Sometimes she does "peculiar things." She was having some personality changes that improved with Citalopram. The visual hallucinations have almost resolved. They close the windows and she is fine. [employer], medications, meals. Taylor Vasquez picks her clothes, she is able to bathe and dress herself.  She las been less interested in showering because she does not like the "cold feeling between taking the clothes off and walking into the shower". Sleep is good, no REM behavior or sleepwalking. She denies any headaches, dizziness, focal numbness/tingling/weakness, no falls.  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 They walk together about 2 miles a day. She has a history of diverticulitis with some diarrhea, that may be a little worse since Donepezil was added, no incontinence. She has been picking her face in different places.  ? ?History on Initial Assessment 05/09/2020: This is a pleasant 76 year old right-handed woman with a  history of hypertension, hyperlipidemia, presenting for evaluation of memory loss. Her partner Taylor Vasquez is present to provide history. She feels her memory is still okay, probably not as good as it used to be since she is older. Taylor Vasquez has known Taylor Vasquez for 40 years, she started noticing changes around 10 years ago. Taylor Vasquez initially noticed directional changes, Taylor Vasquez would be driving and on the way back home, she would "100% pick the wrong way." They moved back to [facility] in 2015, she had stopped driving just before that. She was not getting lost. 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 Taylor Vasquez took over bills in 2014. She does not cook much, Taylor Vasquez does most of the cooking. She has not left the stove on. She denies misplacing things frequently. She is independent with dressing and bathing, no hygiene concerns. Mood is pretty calm, no paranoia or agitation. Recently she has started seeing people in the backyard every night, saying they are walking down the hill (that is not there). Sleep is good, no wandering behaviors. She denies any headaches, dizziness, diplopia, dysarthria/dysphagia, neck/back pain, bowel/bladder dysfunction, focal numbness/tingling/weakness, anosmia, or tremors. 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 Her mother had dementia. No significant head injuries. They drink a glass of wine every night.  ? ? ?PAST MEDICAL HISTORY: ?Past Medical History:  ?Diagnosis Date  ? Decreased estrogen level   ? Essential hypertension   ? History of colonic polyps   ? Hyperlipidemia   ? 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 Right putamen  ? Major neurocognitive  disorder due to Alzheimer's disease, possible 06/08/2020  ? Pure hypercholesterolemia   ? Tinnitus   ? Vitamin D deficiency   ? ? ?MEDICATIONS: ?Current Outpatient Medications on File Prior to Visit  ?Medication Sig Dispense Refill  ?  atorvastatin (LIPITOR) 10 MG tablet 1 tablet    ? Calcium-Vitamin D-Vitamin K (VIACTIV CALCIUM PLUS D) 650-12.5-40 MG-MCG-MCG CHEW 1 tablet    ? citalopram (CELEXA) 20 MG tablet Take 20 mg by mouth daily.    ? lisinopril (ZESTRIL) 20 MG tablet 1 tablet    ? Multiple Vitamins-Minerals (CENTRUM SILVER) tablet 1 tablet    ? ?No current facility-administered medications on file prior to visit.  ? ? ?ALLERGIES: ?No Known Allergies ? ?FAMILY HISTORY: ?Family History  ?Problem Relation Age of Onset  ? Dementia Mother   ?     Unspecified type  ? ? ?SOCIAL HISTORY: ?Social History  ? ?Socioeconomic History  ? Marital status: Married  ?  Spouse name: Not on file  ? Number of children: Not on file  ? Years of education: 18  ? Highest education level: Master's degree (e.g., MA, MS, MEng, MEd, MSW, MBA)  ?Occupational History  ? Occupation: Retired  ?Tobacco Use  ? Smoking status: Never  ? Smokeless tobacco: Never  ?Vaping Use  ? Vaping Use: Never used  ?Substance and Sexual Activity  ? Alcohol use: Yes  ?  Alcohol/week: 7.0 standard drinks  ?  Types: 7 Glasses of wine per week  ?  Comment: 1 glass of wine/night  ? Drug use: No  ? Sexual activity: Not on file  ?Other Topics Concern  ? Not on file  ?Social History Narrative  ? Right handed   ? Lives with friend  ? ?Social Determinants of Health  ? ?Financial Resource Strain: Not on file  ?Food Insecurity: Not on file  ?Transportation Needs: Not on file  ?Physical Activity: Not on file  ?Stress: Not on file  ?Social Connections: Not on file  ?Intimate Partner Violence: Not on file  ? ? ? ?PHYSICAL EXAM: ?Vitals:  ? 07/05/21 1133  ?BP: (!) 145/91  ?Pulse: 70  ?Resp: 18  ?SpO2: 96%  ? ? ?General: No acute distress ?Head:  Normocephalic/atraumatic ?Skin/Extremities: No rash, no edema ?Neurological Exam: alert and awake. No aphasia or dysarthria. Fund of knowledge is reduced.  Recent and remote memory are impaired.  Delayed recall 0/3, unable to copy figure or write a sentence.  Attention and concentration are normal.   Cranial nerves: Pupils equal, round. Extraocular movements intact with no nystagmus. Visual fields full.  No facial asymmetry.  Motor: Bulk and tone normal, muscle strength 5/5 throughout with no pronator drift.   Finger to nose testing intact.  Gait narrow-based and steady, able to tandem walk adequately.  Romberg negative. ? ? ?  05/09/2020  ? 10:51 AM  ?Montreal Cognitive Assessment   ?Visuospatial/ Executive (0/5) 1  ?Naming (0/3) 0  ?Attention: Read list of digits (0/2) 2  ?Attention: Read list of letters (0/1) 0  ?Attention: Serial 7 subtraction starting at 100 (0/3) 0  ?Language: Repeat phrase (0/2) 2  ?Language : Fluency (0/1) 0  ?Abstraction (0/2) 0  ?Delayed Recall (0/5) 0  ?Orientation (0/6) 1  ?Total 6  ?Adjusted Score (based on education) 6  ?  ? ?  07/05/2021  ? 11:00 AM  ?MMSE - Mini Mental State Exam  ?Orientation to time 3  ?Orientation to Place 1  ?Registration 3  ?Attention/ Calculation 4  ?Recall  0  ?Language- name 2 objects 2  ?Language- repeat 1  ?Language- follow 3 step command 3  ?Language- read & follow direction 1  ?Write a sentence 0  ?Copy design 0  ?Total score 18  ?  ? 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 MRI brain showed moderate diffuse atrophy and mild chronic microvascular disease. She is overall stable on Donepezil 10mg  daily.  Today's MMSE is stable at 18/30, delayed recall 0/3, unable to copy figure. Mood improved with Citalopram prescribed by Dr. Lindell Noe. Continue close supervision. Continue control of vascular risk factors, physical exercise, brain stimulation exercises, MIND diet for overall brain health. We discussed results of tests and diagnosis, additional information provided for them as well. Follow-up in 6 months, they know to call for any changes.   ? ?Thank you for allowing me to participate in her care.  Please do not hesitate to call for any questions or concerns. ? ?Sharene Butters, PA-C ? ? ? ?CC: Dr. Lindell Noe ? ? ? ?

## 2021-07-05 NOTE — Patient Instructions (Signed)
Good to see you!  Continue Donepezil 10mg daily  2. Follow-up in 6 months, call for any changes   FALL PRECAUTIONS: Be cautious when walking. Scan the area for obstacles that may increase the risk of trips and falls. When getting up in the mornings, sit up at the edge of the bed for a few minutes before getting out of bed. Consider elevating the bed at the head end to avoid drop of blood pressure when getting up. Walk always in a well-lit room (use night lights in the walls). Avoid area rugs or power cords from appliances in the middle of the walkways. Use a walker or a cane if necessary and consider physical therapy for balance exercise. Get your eyesight checked regularly.  HOME SAFETY: Consider the safety of the kitchen when operating appliances like stoves, microwave oven, and blender. Consider having supervision and share cooking responsibilities until no longer able to participate in those. Accidents with firearms and other hazards in the house should be identified and addressed as well.  ABILITY TO BE LEFT ALONE: If patient is unable to contact 911 operator, consider using LifeLine, or when the need is there, arrange for someone to stay with patients. Smoking is a fire hazard, consider supervision or cessation. Risk of wandering should be assessed by caregiver and if detected at any point, supervision and safe proof recommendations should be instituted.   RECOMMENDATIONS FOR ALL PATIENTS WITH MEMORY PROBLEMS: 1. Continue to exercise (Recommend 30 minutes of walking everyday, or 3 hours every week) 2. Increase social interactions - continue going to [employer] and enjoy social gatherings with friends and family 3. Eat healthy, avoid fried foods and eat more fruits and vegetables 4. Maintain adequate blood pressure, blood sugar, and blood cholesterol level. Reducing the risk of stroke and cardiovascular disease also helps promoting better memory. 5. Avoid stressful situations. Live a simple life  and avoid aggravations. Organize your time and prepare for the next day in anticipation. 6. Sleep well, avoid any interruptions of sleep and avoid any distractions in the bedroom that may interfere with adequate sleep quality 7. Avoid sugar, avoid sweets as there is a strong link between excessive sugar intake, diabetes, and cognitive impairment The Mediterranean diet has been shown to help patients reduce the risk of progressive memory disorders and reduces cardiovascular risk. This includes eating fish, eat fruits and green leafy vegetables, nuts like almonds and hazelnuts, walnuts, and also use olive oil. Avoid fast foods and fried foods as much as possible. Avoid sweets and sugar as sugar use has been linked to worsening of memory function.  There is always a concern of gradual progression of memory problems. If this is the case, then we may need to adjust level of care according to patient needs. Support, both to the patient and caregiver, should then be put into place.  

## 2021-07-06 ENCOUNTER — Emergency Department (HOSPITAL_COMMUNITY): Payer: Medicare PPO

## 2021-07-06 ENCOUNTER — Encounter (HOSPITAL_COMMUNITY): Payer: Self-pay

## 2021-07-06 ENCOUNTER — Emergency Department (HOSPITAL_COMMUNITY)
Admission: EM | Admit: 2021-07-06 | Discharge: 2021-07-07 | Disposition: A | Discharge status: Home / Self Care | Payer: Medicare PPO | Attending: Emergency Medicine | Admitting: Emergency Medicine

## 2021-07-06 DIAGNOSIS — F0390 Unspecified dementia without behavioral disturbance: Secondary | ICD-10-CM | POA: Diagnosis not present

## 2021-07-06 DIAGNOSIS — W010XXA Fall on same level from slipping, tripping and stumbling without subsequent striking against object, initial encounter: Secondary | ICD-10-CM | POA: Insufficient documentation

## 2021-07-06 DIAGNOSIS — S8261XA Displaced fracture of lateral malleolus of right fibula, initial encounter for closed fracture: Secondary | ICD-10-CM | POA: Diagnosis not present

## 2021-07-06 DIAGNOSIS — Y93K1 Activity, walking an animal: Secondary | ICD-10-CM | POA: Insufficient documentation

## 2021-07-06 DIAGNOSIS — S82841A Displaced bimalleolar fracture of right lower leg, initial encounter for closed fracture: Secondary | ICD-10-CM | POA: Diagnosis not present

## 2021-07-06 DIAGNOSIS — M7989 Other specified soft tissue disorders: Secondary | ICD-10-CM | POA: Diagnosis not present

## 2021-07-06 DIAGNOSIS — R609 Edema, unspecified: Secondary | ICD-10-CM | POA: Diagnosis not present

## 2021-07-06 DIAGNOSIS — S92154A Nondisplaced avulsion fracture (chip fracture) of right talus, initial encounter for closed fracture: Secondary | ICD-10-CM | POA: Insufficient documentation

## 2021-07-06 DIAGNOSIS — Z79899 Other long term (current) drug therapy: Secondary | ICD-10-CM | POA: Diagnosis not present

## 2021-07-06 DIAGNOSIS — S8265XA Nondisplaced fracture of lateral malleolus of left fibula, initial encounter for closed fracture: Secondary | ICD-10-CM | POA: Diagnosis not present

## 2021-07-06 DIAGNOSIS — I10 Essential (primary) hypertension: Secondary | ICD-10-CM | POA: Insufficient documentation

## 2021-07-06 DIAGNOSIS — S82842A Displaced bimalleolar fracture of left lower leg, initial encounter for closed fracture: Secondary | ICD-10-CM | POA: Insufficient documentation

## 2021-07-06 DIAGNOSIS — W19XXXA Unspecified fall, initial encounter: Secondary | ICD-10-CM | POA: Diagnosis not present

## 2021-07-06 DIAGNOSIS — S82891A Other fracture of right lower leg, initial encounter for closed fracture: Secondary | ICD-10-CM

## 2021-07-06 DIAGNOSIS — S99912A Unspecified injury of left ankle, initial encounter: Secondary | ICD-10-CM | POA: Diagnosis present

## 2021-07-06 MED ORDER — HYDROCODONE-ACETAMINOPHEN 5-325 MG PO TABS
1.0000 | ORAL_TABLET | Freq: Once | ORAL | Status: AC
  Administered 2021-07-07: 1 via ORAL
  Filled 2021-07-06: qty 1

## 2021-07-06 NOTE — ED Notes (Signed)
Received verbal report from Julie B RN at this time 

## 2021-07-06 NOTE — ED Notes (Signed)
Provider at bedside

## 2021-07-06 NOTE — ED Triage Notes (Signed)
Pt via [facility], fell onto mesh pool covering approx 1800, pt had to crawl back into house. Iced, elevated until approx 2030-2100, pt unable to bear weight on bilateral feet. CNS intact distal to injury. Hematoma to R foot, but pt denies pain.  ? ?No meds PTA ?VSS, A&O, at baseline.  ?

## 2021-07-06 NOTE — ED Provider Notes (Signed)
?[facility] EMERGENCY DEPARTMENT ?Provider Note ? ? ?CSN: [id number] ?Arrival date & time: 07/06/21  2200 ? ?  ? ?History ? ?Chief Complaint  ?Patient presents with  ? Fall  ? Foot Pain  ? ? ?Taylor Vasquez is a 76 y.o. female. ? ?The history is provided by the patient, the spouse and medical records.  ?Fall ? ?Foot Pain ?Taylor Vasquez is a 76 y.o. female who presents to the Emergency Department complaining of fall.  She presents to the emergency department accompanied by her spouse for evaluation of injuries following a fall that occurred around 615 this afternoon.  She was walking the dog outside and tripped, falling onto the pool cover.  She was able to get back inside.  She has been icing her ankles but states that the pain has worsened and now she is unable to bear weight to bilateral lower extremities.  Her pain is greatest in the left ankle.  She is able to put a small amount of weight on the right ankle. ?No reports of recent illnesses.  Denies any head injury, chest pain, shortness of breath, nausea, vomiting, dysuria. ? ?Has a history of hypertension, hyperlipidemia, dementia.  Does not take any anticoagulants.  She does drink wine daily. ? ?  ? ?Home Medications ?Prior to Admission medications   ?Medication Sig Start Date End Date Taking? Authorizing Provider  ?HYDROcodone-acetaminophen (NORCO/VICODIN) 5-325 MG tablet Take 1 tablet by mouth every 6 (six) hours as needed. 07/07/21  Yes Tilden Fossa, MD  ?ondansetron [facility]) 4 MG tablet Take 1 tablet (4 mg total) by mouth every 8 (eight) hours as needed for nausea or vomiting. 07/07/21  Yes Tilden Fossa, MD  ?atorvastatin (LIPITOR) 10 MG tablet 1 tablet    [provider]  ?Calcium-Vitamin D-Vitamin K (VIACTIV CALCIUM PLUS D) 650-12.5-40 MG-MCG-MCG CHEW 1 tablet    [provider]  ?citalopram (CELEXA) 20 MG tablet Take 20 mg by mouth daily.    [provider]  ?donepezil (ARICEPT) 10 MG tablet TAKE 1 TABLET BY  MOUTH DAILY 07/05/21   Marcos Eke, PA-C  ?lisinopril (ZESTRIL) 20 MG tablet 1 tablet    [provider]  ?Multiple Vitamins-Minerals (CENTRUM SILVER) tablet 1 tablet    [provider]  ?   ? ?Allergies    ?Patient has no known allergies.   ? ?Review of Systems   ?Review of Systems  ?All other systems reviewed and are negative. ? ?Physical Exam ?Updated Vital Signs ?BP (!) 141/87 (BP Location: Right Arm)   Pulse 73   Temp 98.1 ?F (36.7 ?C) (Oral)   Resp 18   SpO2 95%  ?Physical Exam ?Vitals and nursing note reviewed.  ?Constitutional:   ?   Appearance: She is well-developed.  ?HENT:  ?   Head: Normocephalic and atraumatic.  ?Cardiovascular:  ?   Rate and Rhythm: Normal rate and regular rhythm.  ?   Heart sounds: No murmur heard. ?Pulmonary:  ?   Effort: Pulmonary effort is normal. No respiratory distress.  ?   Breath sounds: Normal breath sounds.  ?Abdominal:  ?   Palpations: Abdomen is soft.  ?   Tenderness: There is no abdominal tenderness. There is no guarding or rebound.  ?Musculoskeletal:  ?   Comments: 2+ DP pulses bilaterally.  Moderate soft tissue swelling and ecchymosis to bilateral mid feet and ankles.  TTP over the right lateral malleolous.  TTP over bilateral maleoli of the left ankle.  No midfoot  tenderness.  No hip, knee tenderness.    ?Skin: ?   General: Skin is warm and dry.  ?Neurological:  ?   Mental Status: She is alert and oriented to person, place, and time.  ?Psychiatric:     ?   Behavior: Behavior normal.  ? ? ?ED Results / Procedures / Treatments   ?Labs ?(all labs ordered are listed, but only abnormal results are displayed) ?Labs Reviewed - No data to display ? ?EKG ?None ? ?Radiology ?DG Ankle Complete Left ? ?Result Date: 07/06/2021 ?CLINICAL DATA:  Status post fall. EXAM: LEFT ANKLE COMPLETE - 3+ VIEW COMPARISON:  None. FINDINGS: There is an acute fracture deformity involving the left lateral malleolus. A nondisplaced fracture of the left posterior malleolus is  also seen there is no evidence of dislocation. Mild to moderate severity anterior and lateral soft tissue swelling is seen. IMPRESSION: Acute fractures of the left lateral and posterior malleoli. MRI correlation is recommended to exclude occult fracture of the left medial malleolus. Electronically Signed   By: Aram Candelahaddeus  Houston M.D.   On: 07/06/2021 23:46  ? ?DG Ankle Complete Right ? ?Result Date: 07/06/2021 ?CLINICAL DATA:  Fall and trauma to the right foot. EXAM: RIGHT ANKLE - COMPLETE 3+ VIEW; RIGHT FOOT COMPLETE - 3+ VIEW COMPARISON:  None. FINDINGS: There is irregularity and slight fragmentation of the head of the second metatarsal. Although this may be acute and related to trauma an infectious process or osteomyelitis is not excluded clinical correlation is recommended. Additionally there are several curvilinear bony fragments lateral to the hindfoot most consistent with displaced avulsion cortical fractures, likely from the lateral talus. There is also avulsion fracture of the tip of the lateral malleolus. The bones are osteopenic. No dislocation. The ankle mortise is intact. The soft tissue swelling of the dorsum of the foot and ankle. No radiopaque foreign object or soft tissue gas. IMPRESSION: 1. Avulsion fractures of the tip of the lateral malleolus and lateral hindfoot. 2. Irregularity and slight fragmentation of the head of the second metatarsal may be acute/traumatic or sequela of chronic osteomyelitis. Clinical correlation is recommended. 3. Soft tissue swelling of the dorsum of the foot and ankle. Electronically Signed   By: Elgie CollardArash  Radparvar M.D.   On: 07/06/2021 23:48  ? ?DG Foot Complete Left ? ?Result Date: 07/06/2021 ?CLINICAL DATA:  Status post fall. EXAM: LEFT FOOT - COMPLETE 3+ VIEW COMPARISON:  None. FINDINGS: An acute, mildly displaced fracture is seen involving the left lateral malleolus. An additional nondisplaced fracture is seen involving the left posterior malleolus. There is no evidence  of arthropathy or other focal bone abnormality. Moderate severity soft tissue swelling is seen involving the anterior and lateral aspects of the left ankle and proximal left foot. IMPRESSION: 1. Acute fractures of the left lateral and posterior malleoli. 2. Moderate severity soft tissue swelling. Electronically Signed   By: Aram Candelahaddeus  Houston M.D.   On: 07/06/2021 23:48  ? ?DG Foot Complete Right ? ?Result Date: 07/06/2021 ?CLINICAL DATA:  Fall and trauma to the right foot. EXAM: RIGHT ANKLE - COMPLETE 3+ VIEW; RIGHT FOOT COMPLETE - 3+ VIEW COMPARISON:  None. FINDINGS: There is irregularity and slight fragmentation of the head of the second metatarsal. Although this may be acute and related to trauma an infectious process or osteomyelitis is not excluded clinical correlation is recommended. Additionally there are several curvilinear bony fragments lateral to the hindfoot most consistent with displaced avulsion cortical fractures, likely from the lateral talus. There is also avulsion  fracture of the tip of the lateral malleolus. The bones are osteopenic. No dislocation. The ankle mortise is intact. The soft tissue swelling of the dorsum of the foot and ankle. No radiopaque foreign object or soft tissue gas. IMPRESSION: 1. Avulsion fractures of the tip of the lateral malleolus and lateral hindfoot. 2. Irregularity and slight fragmentation of the head of the second metatarsal may be acute/traumatic or sequela of chronic osteomyelitis. Clinical correlation is recommended. 3. Soft tissue swelling of the dorsum of the foot and ankle. Electronically Signed   By: Elgie Collard M.D.   On: 07/06/2021 23:48   ? ?Procedures ?Procedures  ? ?SPLINT APPLICATION ?Date/Time: 4:47 AM ?Authorized by: Tilden Fossa ?Consent: Verbal consent obtained. ?Risks and benefits: risks, benefits and alternatives were discussed ?Consent given by: patient ?Splint applied by: orthopedic technician ?Location details: LLE ?Splint type: posterior  stirrup ?Supplies used: orthoglass, ace wrap ?Post-procedure: The splinted body part was neurovascularly unchanged following the procedure. ?Patient tolerance: Patient tolerated the procedure well with no im

## 2021-07-07 MED ORDER — ONDANSETRON HCL 4 MG PO TABS
4.0000 mg | ORAL_TABLET | Freq: Three times a day (TID) | ORAL | 0 refills | Status: DC | PRN
Start: 2021-07-07 — End: 2023-07-16

## 2021-07-07 MED ORDER — HYDROCODONE-ACETAMINOPHEN 5-325 MG PO TABS: 1.0000 | ORAL_TABLET | Freq: Four times a day (QID) | ORAL | 0 refills | Status: DC | PRN

## 2021-07-07 NOTE — Progress Notes (Signed)
Orthopedic Tech Progress Note ?Patient Details:  ?[name] ?05-29-1945 ?[id number] ? ?Ortho Devices ?Type of Ortho Device: Short leg splint, Stirrup splint ?Ortho Device/Splint Location: lle ?Ortho Device/Splint Interventions: Ordered, Application, Adjustment ?  ?Post Interventions ?Patient Tolerated: Fair ? ?Al Decant ?07/07/2021, 1:33 AM ? ?

## 2021-07-07 NOTE — ED Notes (Signed)
Ortho tech at bedside at this time 

## 2021-07-07 NOTE — ED Notes (Signed)
Ortho tech called at this time for pt ?

## 2021-07-10 DIAGNOSIS — S82842A Displaced bimalleolar fracture of left lower leg, initial encounter for closed fracture: Secondary | ICD-10-CM | POA: Diagnosis not present

## 2021-07-11 DIAGNOSIS — S93432A Sprain of tibiofibular ligament of left ankle, initial encounter: Secondary | ICD-10-CM | POA: Diagnosis not present

## 2021-07-11 DIAGNOSIS — S82842A Displaced bimalleolar fracture of left lower leg, initial encounter for closed fracture: Secondary | ICD-10-CM | POA: Diagnosis not present

## 2021-07-11 DIAGNOSIS — G8918 Other acute postprocedural pain: Secondary | ICD-10-CM | POA: Diagnosis not present

## 2021-07-11 DIAGNOSIS — S93492A Sprain of other ligament of left ankle, initial encounter: Secondary | ICD-10-CM | POA: Diagnosis not present

## 2021-07-24 DIAGNOSIS — Z9889 Other specified postprocedural states: Secondary | ICD-10-CM | POA: Diagnosis not present

## 2021-07-24 DIAGNOSIS — S82842A Displaced bimalleolar fracture of left lower leg, initial encounter for closed fracture: Secondary | ICD-10-CM | POA: Diagnosis not present

## 2021-07-26 DIAGNOSIS — Z9889 Other specified postprocedural states: Secondary | ICD-10-CM | POA: Diagnosis not present

## 2021-07-26 DIAGNOSIS — S82842A Displaced bimalleolar fracture of left lower leg, initial encounter for closed fracture: Secondary | ICD-10-CM | POA: Diagnosis not present

## 2021-08-18 DIAGNOSIS — S82842D Displaced bimalleolar fracture of left lower leg, subsequent encounter for closed fracture with routine healing: Secondary | ICD-10-CM | POA: Diagnosis not present

## 2021-08-18 DIAGNOSIS — Z9889 Other specified postprocedural states: Secondary | ICD-10-CM | POA: Diagnosis not present

## 2021-08-23 DIAGNOSIS — S82842A Displaced bimalleolar fracture of left lower leg, initial encounter for closed fracture: Secondary | ICD-10-CM | POA: Diagnosis not present

## 2021-09-22 DIAGNOSIS — S82842D Displaced bimalleolar fracture of left lower leg, subsequent encounter for closed fracture with routine healing: Secondary | ICD-10-CM | POA: Diagnosis not present

## 2021-09-25 DIAGNOSIS — D649 Anemia, unspecified: Secondary | ICD-10-CM | POA: Diagnosis not present

## 2021-09-25 DIAGNOSIS — G309 Alzheimer's disease, unspecified: Secondary | ICD-10-CM | POA: Diagnosis not present

## 2021-09-25 DIAGNOSIS — I10 Essential (primary) hypertension: Secondary | ICD-10-CM | POA: Diagnosis not present

## 2021-09-28 DIAGNOSIS — S82845D Nondisplaced bimalleolar fracture of left lower leg, subsequent encounter for closed fracture with routine healing: Secondary | ICD-10-CM | POA: Diagnosis not present

## 2021-10-02 DIAGNOSIS — S82845D Nondisplaced bimalleolar fracture of left lower leg, subsequent encounter for closed fracture with routine healing: Secondary | ICD-10-CM | POA: Diagnosis not present

## 2021-10-05 DIAGNOSIS — S82845D Nondisplaced bimalleolar fracture of left lower leg, subsequent encounter for closed fracture with routine healing: Secondary | ICD-10-CM | POA: Diagnosis not present

## 2021-10-11 DIAGNOSIS — S82845D Nondisplaced bimalleolar fracture of left lower leg, subsequent encounter for closed fracture with routine healing: Secondary | ICD-10-CM | POA: Diagnosis not present

## 2021-10-13 DIAGNOSIS — S82845D Nondisplaced bimalleolar fracture of left lower leg, subsequent encounter for closed fracture with routine healing: Secondary | ICD-10-CM | POA: Diagnosis not present

## 2021-10-16 DIAGNOSIS — S82845D Nondisplaced bimalleolar fracture of left lower leg, subsequent encounter for closed fracture with routine healing: Secondary | ICD-10-CM | POA: Diagnosis not present

## 2021-10-20 DIAGNOSIS — S82845D Nondisplaced bimalleolar fracture of left lower leg, subsequent encounter for closed fracture with routine healing: Secondary | ICD-10-CM | POA: Diagnosis not present

## 2021-11-22 DIAGNOSIS — Z0000 Encounter for general adult medical examination without abnormal findings: Secondary | ICD-10-CM | POA: Diagnosis not present

## 2021-11-22 DIAGNOSIS — E7800 Pure hypercholesterolemia, unspecified: Secondary | ICD-10-CM | POA: Diagnosis not present

## 2021-11-22 DIAGNOSIS — F0280 Dementia in other diseases classified elsewhere without behavioral disturbance: Secondary | ICD-10-CM | POA: Diagnosis not present

## 2021-11-22 DIAGNOSIS — Z23 Encounter for immunization: Secondary | ICD-10-CM | POA: Diagnosis not present

## 2021-11-22 DIAGNOSIS — I10 Essential (primary) hypertension: Secondary | ICD-10-CM | POA: Diagnosis not present

## 2021-11-22 DIAGNOSIS — G309 Alzheimer's disease, unspecified: Secondary | ICD-10-CM | POA: Diagnosis not present

## 2021-11-30 DIAGNOSIS — H524 Presbyopia: Secondary | ICD-10-CM | POA: Diagnosis not present

## 2021-11-30 DIAGNOSIS — Z9841 Cataract extraction status, right eye: Secondary | ICD-10-CM | POA: Diagnosis not present

## 2021-11-30 DIAGNOSIS — H5203 Hypermetropia, bilateral: Secondary | ICD-10-CM | POA: Diagnosis not present

## 2021-11-30 DIAGNOSIS — H2512 Age-related nuclear cataract, left eye: Secondary | ICD-10-CM | POA: Diagnosis not present

## 2021-12-15 ENCOUNTER — Other Ambulatory Visit: Payer: Self-pay | Admitting: Family Medicine

## 2021-12-15 DIAGNOSIS — Z1231 Encounter for screening mammogram for malignant neoplasm of breast: Secondary | ICD-10-CM

## 2022-01-05 ENCOUNTER — Encounter: Payer: Self-pay | Admitting: Physician Assistant

## 2022-01-05 ENCOUNTER — Ambulatory Visit: Payer: Medicare PPO | Admitting: Neurology

## 2022-01-05 ENCOUNTER — Ambulatory Visit: Payer: Medicare PPO | Admitting: Physician Assistant

## 2022-01-05 VITALS — BP 138/79 | HR 73 | Ht 69.0 in | Wt 172.0 lb

## 2022-01-05 DIAGNOSIS — F0280 Dementia in other diseases classified elsewhere without behavioral disturbance: Secondary | ICD-10-CM

## 2022-01-05 DIAGNOSIS — G309 Alzheimer's disease, unspecified: Secondary | ICD-10-CM | POA: Diagnosis not present

## 2022-01-05 MED ORDER — MEMANTINE HCL 5 MG PO TABS: ORAL_TABLET | ORAL | 11 refills | Status: DC

## 2022-01-05 NOTE — Patient Instructions (Addendum)
It was a pleasure to see you today at our office.   Recommendations:  Follow up in 6 months Continue donepezil 10 mg daily.   Start memantine 5 mg take one tab at night for 2 weeks and then increase to 5 mg twice a day     Whom to call:  Memory  decline, memory medications: Call our office [phone]   For psychiatric meds, mood meds: Please have your primary care physician manage these medications.   Counseling regarding caregiver distress, including caregiver depression, anxiety and issues regarding community resources, adult day care programs, adult living facilities, or memory care questions:   Feel free to contact Misty Lisabeth Register, Social Worker at [phone]   For assessment of decision of mental capacity and competency:  Call Dr. Erick Blinks, geriatric psychiatrist at [phone]  For guidance in geriatric dementia issues please call [facility] [phone]  For guidance regarding [facility] and if placement were needed at the facility, contact Sidney Ace, Social Worker tel: [phone]  If you have any severe symptoms of a stroke, or other severe issues such as confusion,severe chills or fever, etc call 911 or go to the ER as you may need to be evaluated further   Feel free to visit Facebook page " [email or web address]" for tips of how to care for people with memory problems.    RECOMMENDATIONS FOR ALL PATIENTS WITH MEMORY PROBLEMS: 1. Continue to exercise (Recommend 30 minutes of walking everyday, or 3 hours every week) 2. Increase social interactions - continue going to [place] and enjoy social gatherings with friends and family 3. Eat healthy, avoid fried foods and eat more fruits and vegetables 4. Maintain adequate blood pressure, blood sugar, and blood cholesterol level. Reducing the risk of stroke and cardiovascular disease also helps promoting better memory. 5. Avoid stressful situations. Live a simple life and avoid aggravations.  Organize your time and prepare for the next day in anticipation. 6. Sleep well, avoid any interruptions of sleep and avoid any distractions in the bedroom that may interfere with adequate sleep quality 7. Avoid sugar, avoid sweets as there is a strong link between excessive sugar intake, diabetes, and cognitive impairment We discussed the Mediterranean diet, which has been shown to help patients reduce the risk of progressive memory disorders and reduces cardiovascular risk. This includes eating fish, eat fruits and green leafy vegetables, nuts like almonds and hazelnuts, walnuts, and also use olive oil. Avoid fast foods and fried foods as much as possible. Avoid sweets and sugar as sugar use has been linked to worsening of memory function.  There is always a concern of gradual progression of memory problems. If this is the case, then we may need to adjust level of care according to patient needs. Support, both to the patient and caregiver, should then be put into place.    The Alzheimer's Association is here all day, every day for people facing Alzheimer's disease through our free 24/7 Helpline: [phone]. The Helpline provides reliable information and support to all those who need assistance, such as individuals living with memory loss, Alzheimer's or other dementia, caregivers, health care professionals and the public.  Our highly trained and knowledgeable staff can help you with: Understanding memory loss, dementia and Alzheimer's  Medications and other treatment options  General information about aging and brain health  Skills to provide quality care and to find the best care from professionals  Legal, financial and living-arrangement decisions Our Helpline also features: Confidential care consultation  provided by master's level clinicians who can help with decision-making support, crisis assistance and education on issues families face every day  Help in a caller's preferred language using  our translation service that features more than 200 languages and dialects  Referrals to local community programs, services and ongoing support     FALL PRECAUTIONS: Be cautious when walking. Scan the area for obstacles that may increase the risk of trips and falls. When getting up in the mornings, sit up at the edge of the bed for a few minutes before getting out of bed. Consider elevating the bed at the head end to avoid drop of blood pressure when getting up. Walk always in a well-lit room (use night lights in the walls). Avoid area rugs or power cords from appliances in the middle of the walkways. Use a walker or a cane if necessary and consider physical therapy for balance exercise. Get your eyesight checked regularly.  FINANCIAL OVERSIGHT: Supervision, especially oversight when making financial decisions or transactions is also recommended.  HOME SAFETY: Consider the safety of the kitchen when operating appliances like stoves, microwave oven, and blender. Consider having supervision and share cooking responsibilities until no longer able to participate in those. Accidents with firearms and other hazards in the house should be identified and addressed as well.   ABILITY TO BE LEFT ALONE: If patient is unable to contact 911 operator, consider using LifeLine, or when the need is there, arrange for someone to stay with patients. Smoking is a fire hazard, consider supervision or cessation. Risk of wandering should be assessed by caregiver and if detected at any point, supervision and safe proof recommendations should be instituted.  MEDICATION SUPERVISION: Inability to self-administer medication needs to be constantly addressed. Implement a mechanism to ensure safe administration of the medications.        Mediterranean Diet A Mediterranean diet refers to food and lifestyle choices that are based on the traditions of countries located on the [employer]. This way of eating has been  shown to help prevent certain conditions and improve outcomes for people who have chronic diseases, like kidney disease and heart disease. What are tips for following this plan? Lifestyle  Cook and eat meals together with your family, when possible. Drink enough fluid to keep your urine clear or pale yellow. Be physically active every day. This includes: Aerobic exercise like running or swimming. Leisure activities like gardening, walking, or housework. Get 7-8 hours of sleep each night. If recommended by your health care provider, drink red wine in moderation. This means 1 glass a day for nonpregnant women and 2 glasses a day for men. A glass of wine equals 5 oz (150 mL). Reading food labels  Check the serving size of packaged foods. For foods such as rice and pasta, the serving size refers to the amount of cooked product, not dry. Check the total fat in packaged foods. Avoid foods that have saturated fat or trans fats. Check the ingredients list for added sugars, such as corn syrup. Shopping  At the grocery store, buy most of your food from the areas near the walls of the store. This includes: Fresh fruits and vegetables (produce). Grains, beans, nuts, and seeds. Some of these may be available in unpackaged forms or large amounts (in bulk). Fresh seafood. Poultry and eggs. Low-fat dairy products. Buy whole ingredients instead of prepackaged foods. Buy fresh fruits and vegetables in-season from local farmers markets. Buy frozen fruits and vegetables in resealable bags. If you  do not have access to quality fresh seafood, buy precooked frozen shrimp or canned fish, such as tuna, salmon, or sardines. Buy small amounts of raw or cooked vegetables, salads, or olives from the deli or salad bar at your store. Stock your pantry so you always have certain foods on hand, such as olive oil, canned tuna, canned tomatoes, rice, pasta, and beans. Cooking  Cook foods with extra-virgin olive oil instead  of using butter or other vegetable oils. Have meat as a side dish, and have vegetables or grains as your main dish. This means having meat in small portions or adding small amounts of meat to foods like pasta or stew. Use beans or vegetables instead of meat in common dishes like chili or lasagna. Experiment with different cooking methods. Try roasting or broiling vegetables instead of steaming or sauteing them. Add frozen vegetables to soups, stews, pasta, or rice. Add nuts or seeds for added healthy fat at each meal. You can add these to yogurt, salads, or vegetable dishes. Marinate fish or vegetables using olive oil, lemon juice, garlic, and fresh herbs. Meal planning  Plan to eat 1 vegetarian meal one day each week. Try to work up to 2 vegetarian meals, if possible. Eat seafood 2 or more times a week. Have healthy snacks readily available, such as: Vegetable sticks with hummus. Greek yogurt. Fruit and nut trail mix. Eat balanced meals throughout the week. This includes: Fruit: 2-3 servings a day Vegetables: 4-5 servings a day Low-fat dairy: 2 servings a day Fish, poultry, or lean meat: 1 serving a day Beans and legumes: 2 or more servings a week Nuts and seeds: 1-2 servings a day Whole grains: 6-8 servings a day Extra-virgin olive oil: 3-4 servings a day Limit red meat and sweets to only a few servings a month What are my food choices? Mediterranean diet Recommended Grains: Whole-grain pasta. Brown rice. Bulgar wheat. Polenta. Couscous. Whole-wheat bread. Orpah Cobb. Vegetables: Artichokes. Beets. Broccoli. Cabbage. Carrots. Eggplant. Green beans. Chard. Kale. Spinach. Onions. Leeks. Peas. Squash. Tomatoes. Peppers. Radishes. Fruits: Apples. Apricots. Avocado. Berries. Bananas. Cherries. Dates. Figs. Grapes. Lemons. Melon. Oranges. Peaches. Plums. Pomegranate. Meats and other protein foods: Beans. Almonds. Sunflower seeds. Pine nuts. Peanuts. Cod. Salmon. Scallops. Shrimp.  Tuna. Tilapia. Clams. Oysters. Eggs. Dairy: Low-fat milk. Cheese. Greek yogurt. Beverages: Water. Red wine. Herbal tea. Fats and oils: Extra virgin olive oil. Avocado oil. Grape seed oil. Sweets and desserts: [place] yogurt with honey. Baked apples. Poached pears. Trail mix. Seasoning and other foods: Basil. Cilantro. Coriander. Cumin. Mint. Parsley. Sage. Rosemary. Tarragon. Garlic. Oregano. Thyme. Pepper. Balsalmic vinegar. Tahini. Hummus. Tomato sauce. Olives. Mushrooms. Limit these Grains: Prepackaged pasta or rice dishes. Prepackaged cereal with added sugar. Vegetables: Deep fried potatoes (french fries). Fruits: Fruit canned in syrup. Meats and other protein foods: Beef. Pork. Lamb. Poultry with skin. Hot dogs. Tomasa Blase. Dairy: Ice cream. Sour cream. Whole milk. Beverages: Juice. Sugar-sweetened soft drinks. Beer. Liquor and spirits. Fats and oils: Butter. Canola oil. Vegetable oil. Beef fat (tallow). Lard. Sweets and desserts: Cookies. Cakes. Pies. Candy. Seasoning and other foods: Mayonnaise. Premade sauces and marinades. The items listed may not be a complete list. Talk with your dietitian about what dietary choices are right for you. Summary The Mediterranean diet includes both food and lifestyle choices. Eat a variety of fresh fruits and vegetables, beans, nuts, seeds, and whole grains. Limit the amount of red meat and sweets that you eat. Talk with your health care provider about whether it is safe  for you to drink red wine in moderation. This means 1 glass a day for nonpregnant women and 2 glasses a day for men. A glass of wine equals 5 oz (150 mL). This information is not intended to replace advice given to you by your health care provider. Make sure you discuss any questions you have with your health care provider. Document Released: 10/27/2015 Document Revised: 11/29/2015 Document Reviewed: 10/27/2015 Elsevier Interactive Patient Education  2017 [employer].

## 2022-01-05 NOTE — Progress Notes (Signed)
Assessment/Plan:   Dementia likely due to  Taylor Vasquez is a very pleasant 76 y.o. RH female seen today in follow up for memory loss.  05/2020 MRI brain personally reviewed was remarkable for  moderate diffuse volume loss and mild chronic microvascular disease. Neuropsychological evaluation in 05/2020 showed diffuse and severe cognitive impairment with a diagnosis of Major Neurocognitive Disorder, etiology likely Alzheimer's disease. LBD could not be ruled out however felt less likely. She is on Donepezil 10 mg daily without significant side effects.      Follow up in  6 months. Continue donepezil 10 mg daily. Side effects were discussed  Continue to control mood as per PCP  Recommend good control of cardiovascular risk factors.       Subjective:    This patient is accompanied in the office by her partner Arbie Cookey who supplements the history.  Previous records as well as any outside records available were reviewed prior to todays visit. Last seen on 07/05/21 at which time MMSE was 18/30   Any changes in memory since last visit?  She continues to report that " there is nothing wrong with me".  Still has issues with recent conversations or names without subjective complaints of worsening memory repeats oneself?  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  Patient denies   Leaving objects in unusual places?  Sometimes she leaves glasses in the bathroom.   Ambulates  with difficulty?  She walks with her partner daily . She needed surgery after "leaving this office back in April, went home and fell on the covered pool, fracturing her ankle " after a mechanical fall. SHe required  R foot surgery , she is doing well now "like it never happened" Any head injuries?  Patient denies   History of seizures?   Patient denies   Wandering behavior?  Patient denies   Patient drives?   Patient no longer drives   Any mood changes since last visit?  Patient denies  She decreased Lexapro because "dose was too high" Any worsening depression?:  Patient denies   Hallucinations? "If it a light she believes  there is a car " " As long as she closes the windows she is fine"   Paranoia?  She thinks that people on  the TV is watching her Patient reports that sleeps well without vivid dreams, talk a little , REM behavior or sleepwalking   History of sleep apnea?  Patient denies   Any hygiene concerns?  Patient denies  "We got to fight about that ,she thinks she takes one every day"  Independent of bathing and dressing?  Endorsed  Does the patient needs help with medications?  Partner in charge . She discontinued her atorvastatin as per PCP  Who is in charge of the finances? Partner  is in charge   Any changes in appetite?  Patient denies   Patient have trouble swallowing? Patient denies   Does the patient cook?  Patient denies   Any headaches?  Patient denies  She has a L cataract but not worsening Double vision? Patient denies   Any focal numbness or tingling?  Patient denies   Chronic back pain Patient denies   Unilateral weakness?  Patient denies   Any tremors?  Patient denies   Any history of anosmia?  Patient denies   Any incontinence of urine?  Patient denies   Any bowel dysfunction?  History of diverticulitis with some chronic intermittent diarrhea Patient lives with: Micah Noel   History on Initial Assessment 05/09/2020: This is a pleasant 76 year old right-handed woman with a history of hypertension, hyperlipidemia, presenting for evaluation of memory loss. Her partner Taylor Vasquez is present to provide history. She feels her memory is still okay, probably not as good as it used to be since she is older. Taylor Vasquez has known Taylor Vasquez for 40 years, she started noticing changes around 10 years ago. Taylor Vasquez initially noticed directional changes, Taylor Vasquez would be driving and on the way back home, she would "100% pick  the wrong way." They moved back to [facility] in 2015, she had stopped driving just before that. She was not getting lost. 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 Taylor Vasquez took over bills in 2014. She does not cook much, Taylor Vasquez does most of the cooking. She has not left the stove on. She denies misplacing things frequently. She is independent with dressing and bathing, no hygiene concerns. Mood is pretty calm, no paranoia or agitation. Recently she has started seeing people in the backyard every night, saying they are walking down the hill (that is not there). Sleep is good, no wandering behaviors. She denies any headaches, dizziness, diplopia, dysarthria/dysphagia, neck/back pain, bowel/bladder dysfunction, focal numbness/tingling/weakness, anosmia, or tremors. 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 No significant head injuries. They drink a glass of wine every night.   PREVIOUS MEDICATIONS:   CURRENT MEDICATIONS:  Outpatient Encounter Medications as of 01/05/2022  Medication Sig   memantine (NAMENDA) 5 MG tablet Take 1 tablet (5 mg at night) for 2 weeks, then increase to 1 tablet (5 mg) twice a day   atorvastatin (LIPITOR) 10 MG tablet 1 tablet   Calcium-Vitamin D-Vitamin K (VIACTIV CALCIUM PLUS D) 650-12.5-40 MG-MCG-MCG CHEW 1 tablet   citalopram (CELEXA) 20 MG tablet Take 20 mg by mouth daily.   donepezil (ARICEPT) 10 MG tablet TAKE 1 TABLET BY MOUTH DAILY   HYDROcodone-acetaminophen (NORCO/VICODIN) 5-325 MG tablet Take 1 tablet by mouth every 6 (six) hours as needed.   lisinopril (ZESTRIL) 20 MG tablet 1 tablet   Multiple Vitamins-Minerals (CENTRUM SILVER) tablet 1 tablet   ondansetron (ZOFRAN) 4 MG tablet Take 1 tablet (4 mg total) by mouth every 8 (eight) hours as needed for nausea or vomiting.   No facility-administered encounter medications on  file as of 01/05/2022.       01/05/2022   11:00 AM 07/05/2021   11:00 AM  MMSE - Mini Mental State Exam  Orientation to time 0 3  Orientation to Place 1 1  Registration 3 3  Attention/ Calculation 2 4  Recall 0 0  Language- name 2 objects 2 2  Language- repeat 1 1  Language- follow 3 step command 3 3  Language- read & follow direction 1 1  Write a sentence 0 0  Copy design 0 0  Total score 13 18      05/09/2020   10:51 AM  Montreal Cognitive Assessment   Visuospatial/ Executive (0/5) 1  Naming (0/3) 0  Attention: Read list of digits (0/2) 2  Attention: Read list of letters (0/1) 0  Attention: Serial 7 subtraction starting at 100 (0/3) 0  Language: Repeat phrase (0/2) 2  Language : Fluency (0/1) 0  Abstraction (0/2) 0  Delayed Recall (0/5) 0  Orientation (0/6) 1  Total 6  Adjusted Score (based on education) 6    Objective:     PHYSICAL EXAMINATION:    VITALS:   Vitals:   01/05/22 1121  BP: 138/79  Pulse: 73  SpO2: 94%  Weight: 172 lb (78 kg)  Height: 5\' 9"  (1.753 m)    GEN:  The patient appears stated age and is in NAD. HEENT:  Normocephalic, atraumatic.   Neurological examination:  General: NAD, well-groomed, appears stated age. Orientation: The patient is alert. Oriented to person, not to place or date Cranial nerves: There is good facial symmetry.The speech is fluent and clear. No aphasia or dysarthria. Fund of knowledge is reduced. Recent and remote memory are impaired. Attention and concentration are reduced.  Able to name objects and repeat phrases.  Hearing is intact to conversational tone.   Delayed recall 0/3 Sensation: Sensation is intact to light touch throughout Motor: Strength is at least antigravity x4. Tremors: none  DTR's 2/4 in UE/LE     Movement examination: Tone: There is normal tone in the UE/LE Abnormal movements:  no tremor.  No myoclonus.  No asterixis.   Coordination:  There is decremation with RAM's. Normal finger to nose  normal on the R, slow response on the L  Gait and Station: The patient has no difficulty arising out of a deep-seated chair without the use of the hands. The patient's stride length is good.  Gait is cautious and narrow.    Thank you for allowing the opportunity to participate in the care of this nice patient. Please do not hesitate to contact us for any questions or concerns.   Total time spent on today's visit was 30 minutes dedicated to this patient today, preparing to see patient, examining the patient, ordering tests and/or medications and counseling the patient, documenting clinical information in the EHR or other health record, independently interpreting results and communicating results to the patient/family, discussing treatment and goals, answering patient's questions and coordinating care.  Cc:  [place], MD  Shon Hale 01/05/2022 12:24 PM

## 2022-01-18 DIAGNOSIS — Z0279 Encounter for issue of other medical certificate: Secondary | ICD-10-CM

## 2022-01-19 ENCOUNTER — Ambulatory Visit
Admission: RE | Admit: 2022-01-19 | Discharge: 2022-01-19 | Disposition: A | Discharge status: Home / Self Care | Payer: Medicare PPO | Source: Ambulatory Visit | Attending: Family Medicine | Admitting: Family Medicine

## 2022-01-19 ENCOUNTER — Telehealth: Payer: Self-pay | Admitting: Physician Assistant

## 2022-01-19 DIAGNOSIS — Z1231 Encounter for screening mammogram for malignant neoplasm of breast: Secondary | ICD-10-CM

## 2022-01-19 NOTE — Telephone Encounter (Signed)
Only one was received and filled out.

## 2022-01-19 NOTE — Telephone Encounter (Signed)
I spoke with Arbie Cookey and papers are completed. Thank me for calling her back.

## 2022-01-19 NOTE — Telephone Encounter (Signed)
Patient's spouse Arbie Cookey called to see if two different types of forms have been received for completion?

## 2022-01-20 ENCOUNTER — Other Ambulatory Visit: Payer: Self-pay | Admitting: Neurology

## 2022-01-23 ENCOUNTER — Other Ambulatory Visit: Payer: Self-pay | Admitting: Family Medicine

## 2022-01-23 DIAGNOSIS — R928 Other abnormal and inconclusive findings on diagnostic imaging of breast: Secondary | ICD-10-CM

## 2022-02-07 ENCOUNTER — Ambulatory Visit
Admission: RE | Admit: 2022-02-07 | Discharge: 2022-02-07 | Disposition: A | Discharge status: Home / Self Care | Payer: Medicare PPO | Source: Ambulatory Visit | Attending: Family Medicine | Admitting: Family Medicine

## 2022-02-07 ENCOUNTER — Ambulatory Visit: Payer: Medicare PPO

## 2022-02-07 DIAGNOSIS — R928 Other abnormal and inconclusive findings on diagnostic imaging of breast: Secondary | ICD-10-CM

## 2022-02-22 ENCOUNTER — Other Ambulatory Visit: Payer: Medicare PPO

## 2022-02-27 DIAGNOSIS — G309 Alzheimer's disease, unspecified: Secondary | ICD-10-CM | POA: Diagnosis not present

## 2022-02-27 DIAGNOSIS — I10 Essential (primary) hypertension: Secondary | ICD-10-CM | POA: Diagnosis not present

## 2022-03-07 ENCOUNTER — Ambulatory Visit: Payer: Medicare PPO

## 2022-03-07 ENCOUNTER — Ambulatory Visit
Admission: RE | Admit: 2022-03-07 | Discharge: 2022-03-07 | Disposition: A | Discharge status: Home / Self Care | Payer: Medicare PPO | Source: Ambulatory Visit | Attending: Family Medicine | Admitting: Family Medicine

## 2022-03-07 DIAGNOSIS — R928 Other abnormal and inconclusive findings on diagnostic imaging of breast: Secondary | ICD-10-CM | POA: Diagnosis not present

## 2022-04-18 ENCOUNTER — Other Ambulatory Visit: Payer: Self-pay | Admitting: Physician Assistant

## 2022-07-12 ENCOUNTER — Other Ambulatory Visit: Payer: Self-pay | Admitting: Neurology

## 2022-07-12 ENCOUNTER — Ambulatory Visit: Payer: Medicare PPO | Admitting: Physician Assistant

## 2022-07-12 ENCOUNTER — Encounter: Payer: Self-pay | Admitting: Physician Assistant

## 2022-07-12 VITALS — BP 136/80 | HR 83 | Resp 20 | Ht 69.0 in | Wt 182.0 lb

## 2022-07-12 DIAGNOSIS — F0280 Dementia in other diseases classified elsewhere without behavioral disturbance: Secondary | ICD-10-CM

## 2022-07-12 DIAGNOSIS — G309 Alzheimer's disease, unspecified: Secondary | ICD-10-CM | POA: Diagnosis not present

## 2022-07-12 NOTE — Progress Notes (Signed)
Assessment/Plan:   Dementia likely due to Alzheimer's disease with behavioral disturbance  Taylor Vasquez is a very pleasant 77 y.o. RH female with a history of hypertension, hyperlipidemia and a diagnosis of dementia likely due to Alzheimer's disease (LBD could not be ruled out although less likely) as per neuropsychological evaluation on March 2022 seen today in follow up for memory loss. Patient is currently on donepezil 10 mg daily and memantine 5 mg twice daily.  MMSE today is 14/30, stable from prior.  Prior MRI brain personally reviewed was remarkable for moderate diffuse volume loss and mild chronic microvascular disease.      Follow up in  6 months. Continue donepezil 10 mg daily, side effects discussed Continue memantine 5 mg twice daily, side effects discussed Recommend good control of her cardiovascular risk factors Continue to control mood as per PCP    Subjective:    This patient is accompanied in the office by her partner Taylor Vasquez  who supplements the history.  Previous records as well as any outside records available were reviewed prior to todays visit. Patient was last seen on 01/05/2022 at which time her MMSE was 13/30. Loves singing shows and [employer].   Any changes in memory since last visit?  "About the same ".  She may forget recent conversations or names, "not much different from prior ". repeats oneself?  Endorsed Disoriented when walking into a room?  Patient denies. Doors are locked because she may not remember which door she went out from and would not remember how to come back.  Leaving objects in unusual places?   denies   Wandering behavior?  denies   Any personality changes since last visit?  denies   Any worsening depression?:  "she was pretty negative in February and then her grandson came to visit and changed everything".  She is in a good mood now. Hallucinations or paranoia?  She is concerned about "people on TV can see [place] " or someone being at the carport.   "She is obsessed with someone being out there, so she closes the curtains down as before " Seizures?    denies    Any sleep changes? Sleeps well. "Talks a lot during her sleep, in her dreams"  REM behavior or sleepwalking   Sleep apnea?   denies   Any hygiene concerns? "She does not like the change from warm to cold, so she may fuss a little take a shower" Independent of bathing and dressing?  Endorsed  Does the patient needs help with medications? Taylor Vasquez is in charge,  has a pillbox   Who is in charge of the finances? Taylor Vasquez  is in charge     Any changes in appetite?  Denies. Gained weight , likes ice cream      Patient have trouble swallowing?  denies   Does the patient cook? No    Any headaches?   denies   Chronic back pain  denies   Ambulates with difficulty?     denies   Recent falls or head injuries? denies     Unilateral weakness, numbness or tingling?    denies   Any tremors?  denies   Any anosmia?  Patient denies   Any incontinence of urine?  denies   Any bowel dysfunction?     denies      Patient lives  with partner Taylor Vasquez  Does the patient drive? No longer drives    History on Initial Assessment 05/09/2020: This is a pleasant  77 year old right-handed woman with a history of hypertension, hyperlipidemia, presenting for evaluation of memory loss. Her partner Taylor Vasquez is present to provide history. She feels her memory is still okay, probably not as good as it used to be since she is older. Taylor Vasquez has known Taylor Vasquez for 40 years, she started noticing changes around 10 years ago. Taylor Vasquez initially noticed directional changes, Taylor Vasquez would be driving and on the way back home, she would "100% pick the wrong way." They moved back to [facility] in 2015, she had stopped driving just before that. She was not getting lost. 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 Taylor Vasquez took over bills in  2014. She does not cook much, Taylor Vasquez does most of the cooking. She has not left the stove on. She denies misplacing things frequently. She is independent with dressing and bathing, no hygiene concerns. Mood is pretty calm, no paranoia or agitation. Recently she has started seeing people in the backyard every night, saying they are walking down the hill (that is not there). Sleep is good, no wandering behaviors. She denies any headaches, dizziness, diplopia, dysarthria/dysphagia, neck/back pain, bowel/bladder dysfunction, focal numbness/tingling/weakness, anosmia, or tremors. 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 No significant head injuries. They drink a glass of wine every night.  PREVIOUS MEDICATIONS:   CURRENT MEDICATIONS:  Outpatient Encounter Medications as of 07/12/2022  Medication Sig   atorvastatin (LIPITOR) 10 MG tablet 1 tablet   Calcium-Vitamin D-Vitamin K (VIACTIV CALCIUM PLUS D) 650-12.5-40 MG-MCG-MCG CHEW 1 tablet   citalopram (CELEXA) 20 MG tablet Take 20 mg by mouth daily.   donepezil (ARICEPT) 10 MG tablet TAKE 1 TABLET BY MOUTH DAILY   HYDROcodone-acetaminophen (NORCO/VICODIN) 5-325 MG tablet Take 1 tablet by mouth every 6 (six) hours as needed.   lisinopril (ZESTRIL) 20 MG tablet 1 tablet   memantine (NAMENDA) 5 MG tablet Take 1 tablet (5 mg at night) for 2 weeks, then increase to 1 tablet (5 mg) twice a day   Multiple Vitamins-Minerals (CENTRUM SILVER) tablet 1 tablet   ondansetron (ZOFRAN) 4 MG tablet Take 1 tablet (4 mg total) by mouth every 8 (eight) hours as needed for nausea or vomiting.   No facility-administered encounter medications on file as of 07/12/2022.       07/12/2022   12:00 PM 01/05/2022   11:00 AM 07/05/2021   11:00 AM  MMSE - Mini Mental State Exam  Orientation to time 0 0 3  Orientation to Place Registration Attention/ Calculation Recall 0 0 0  Language- name 2 objects Language- repeat  Language- follow 3 step command Language- read & follow direction Write a sentence 1 0 0  Copy design 0 0 0  Total score 05/09/2020   10:51 AM  Montreal Cognitive Assessment   Visuospatial/ Executive (0/5) 1  Naming (0/3) 0  Attention: Read list of digits (0/2) 2  Attention: Read list of letters (0/1) 0  Attention: Serial 7 subtraction starting at 100 (0/3) 0  Language: Repeat phrase (0/2) 2  Language : Fluency (0/1) 0  Abstraction (0/2) 0  Delayed Recall (0/5) 0  Orientation (0/6) 1  Total 6  Adjusted  Score (based on education) 6    Objective:     PHYSICAL EXAMINATION:    VITALS:   Vitals:   07/12/22 1059  BP: 136/80  Pulse: 83  Resp: 20  SpO2: 97%  Weight: 182 lb (82.6 kg)  Height: 5\' 9"  (1.753 m)    GEN:  The patient appears stated age and is in NAD. HEENT:  Normocephalic, atraumatic.   Neurological examination:  General: NAD, well-groomed, appears stated age. Orientation: The patient is alert. Oriented to person, not to place and date Cranial nerves: There is good facial symmetry. The speech is fluent and clear. No aphasia or dysarthria. Fund of knowledge is reduced. Recent and remote memory are impaired. Attention and concentration are reduced.  Able to name objects 1/2 and able to repeat phrases.  Hearing is intact to conversational tone.  Sensation: Sensation is intact to light touch throughout Motor: Strength is at least antigravity x4. DTR's 2/4 in UE/LE     Movement examination: Tone: There is normal tone in the UE/LE Abnormal movements:  no tremor.  No myoclonus.  No asterixis.   Coordination:  There is decremation with RAM's. Normal finger to nose on the right, slow response on the left. Gait and Station: The patient has no difficulty arising out of a deep-seated chair without the use of the hands. The patient's stride length is good.  Gait is cautious and narrow.    Thank you for allowing [place] the opportunity to  participate in the care of this nice patient. Please do not hesitate to contact us for any questions or concerns.   Total time spent on today's visit was 21 minutes dedicated to this patient today, preparing to see patient, examining the patient, ordering tests and/or medications and counseling the patient, documenting clinical information in the EHR or other health record, independently interpreting results and communicating results to the patient/family, discussing treatment and goals, answering patient's questions and coordinating care.  Cc:  Shon Hale, MD  Marlowe Kays 07/12/2022 12:16 PM

## 2022-07-12 NOTE — Patient Instructions (Signed)
It was a pleasure to see you today at our office.   Recommendations:  Follow up in 6 months Continue donepezil 10 mg daily.   Continue memantine 5 mg   twice a day     Whom to call:  Memory  decline, memory medications: Call our office [phone]   For psychiatric meds, mood meds: Please have your primary care physician manage these medications.      For assessment of decision of mental capacity and competency:  Call Dr. Erick Blinks, geriatric psychiatrist at [phone]  For guidance in geriatric dementia issues please call [facility] [phone]  For guidance regarding [facility] and if placement were needed at the facility, contact Sidney Ace, Social Worker tel: [phone]  If you have any severe symptoms of a stroke, or other severe issues such as confusion,severe chills or fever, etc call 911 or go to the ER as you may need to be evaluated further      RECOMMENDATIONS FOR ALL PATIENTS WITH MEMORY PROBLEMS: 1. Continue to exercise (Recommend 30 minutes of walking everyday, or 3 hours every week) 2. Increase social interactions - continue going to [place] and enjoy social gatherings with friends and family 3. Eat healthy, avoid fried foods and eat more fruits and vegetables 4. Maintain adequate blood pressure, blood sugar, and blood cholesterol level. Reducing the risk of stroke and cardiovascular disease also helps promoting better memory. 5. Avoid stressful situations. Live a simple life and avoid aggravations. Organize your time and prepare for the next day in anticipation. 6. Sleep well, avoid any interruptions of sleep and avoid any distractions in the bedroom that may interfere with adequate sleep quality 7. Avoid sugar, avoid sweets as there is a strong link between excessive sugar intake, diabetes, and cognitive impairment We discussed the Mediterranean diet, which has been shown to help patients reduce the risk of progressive  memory disorders and reduces cardiovascular risk. This includes eating fish, eat fruits and green leafy vegetables, nuts like almonds and hazelnuts, walnuts, and also use olive oil. Avoid fast foods and fried foods as much as possible. Avoid sweets and sugar as sugar use has been linked to worsening of memory function.  There is always a concern of gradual progression of memory problems. If this is the case, then we may need to adjust level of care according to patient needs. Support, both to the patient and caregiver, should then be put into place.    The Alzheimer's Association is here all day, every day for people facing Alzheimer's disease through our free 24/7 Helpline: [phone]. The Helpline provides reliable information and support to all those who need assistance, such as individuals living with memory loss, Alzheimer's or other dementia, caregivers, health care professionals and the public.  Our highly trained and knowledgeable staff can help you with: Understanding memory loss, dementia and Alzheimer's  Medications and other treatment options  General information about aging and brain health  Skills to provide quality care and to find the best care from professionals  Legal, financial and living-arrangement decisions Our Helpline also features: Confidential care consultation provided by master's level clinicians who can help with decision-making support, crisis assistance and education on issues families face every day  Help in a caller's preferred language using our translation service that features more than 200 languages and dialects  Referrals to local community programs, services and ongoing support     FALL PRECAUTIONS: Be cautious when walking. Scan the area for obstacles that may  increase the risk of trips and falls. When getting up in the mornings, sit up at the edge of the bed for a few minutes before getting out of bed. Consider elevating the bed at the head end to avoid  drop of blood pressure when getting up. Walk always in a well-lit room (use night lights in the walls). Avoid area rugs or power cords from appliances in the middle of the walkways. Use a walker or a cane if necessary and consider physical therapy for balance exercise. Get your eyesight checked regularly.  FINANCIAL OVERSIGHT: Supervision, especially oversight when making financial decisions or transactions is also recommended.  HOME SAFETY: Consider the safety of the kitchen when operating appliances like stoves, microwave oven, and blender. Consider having supervision and share cooking responsibilities until no longer able to participate in those. Accidents with firearms and other hazards in the house should be identified and addressed as well.   ABILITY TO BE LEFT ALONE: If patient is unable to contact 911 operator, consider using LifeLine, or when the need is there, arrange for someone to stay with patients. Smoking is a fire hazard, consider supervision or cessation. Risk of wandering should be assessed by caregiver and if detected at any point, supervision and safe proof recommendations should be instituted.  MEDICATION SUPERVISION: Inability to self-administer medication needs to be constantly addressed. Implement a mechanism to ensure safe administration of the medications.        Mediterranean Diet A Mediterranean diet refers to food and lifestyle choices that are based on the traditions of countries located on the [employer] [place]. This way of eating has been shown to help prevent certain conditions and improve outcomes for people who have chronic diseases, like kidney disease and heart disease. What are tips for following this plan? Lifestyle  Cook and eat meals together with your family, when possible. Drink enough fluid to keep your urine clear or pale yellow. Be physically active every day. This includes: Aerobic exercise like running or swimming. Leisure activities like  gardening, walking, or housework. Get 7-8 hours of sleep each night. If recommended by your health care provider, drink red wine in moderation. This means 1 glass a day for nonpregnant women and 2 glasses a day for men. A glass of wine equals 5 oz (150 mL). Reading food labels  Check the serving size of packaged foods. For foods such as rice and pasta, the serving size refers to the amount of cooked product, not dry. Check the total fat in packaged foods. Avoid foods that have saturated fat or trans fats. Check the ingredients list for added sugars, such as corn syrup. Shopping  At the grocery store, buy most of your food from the areas near the walls of the store. This includes: Fresh fruits and vegetables (produce). Grains, beans, nuts, and seeds. Some of these may be available in unpackaged forms or large amounts (in bulk). Fresh seafood. Poultry and eggs. Low-fat dairy products. Buy whole ingredients instead of prepackaged foods. Buy fresh fruits and vegetables in-season from local farmers markets. Buy frozen fruits and vegetables in resealable bags. If you do not have access to quality fresh seafood, buy precooked frozen shrimp or canned fish, such as tuna, salmon, or sardines. Buy small amounts of raw or cooked vegetables, salads, or olives from the deli or salad bar at your store. Stock your pantry so you always have certain foods on hand, such as olive oil, canned tuna, canned tomatoes, rice, pasta, and beans. Cooking  [employer]  foods with extra-virgin olive oil instead of using butter or other vegetable oils. Have meat as a side dish, and have vegetables or grains as your main dish. This means having meat in small portions or adding small amounts of meat to foods like pasta or stew. Use beans or vegetables instead of meat in common dishes like chili or lasagna. Experiment with different cooking methods. Try roasting or broiling vegetables instead of steaming or sauteing them. Add frozen  vegetables to soups, stews, pasta, or rice. Add nuts or seeds for added healthy fat at each meal. You can add these to yogurt, salads, or vegetable dishes. Marinate fish or vegetables using olive oil, lemon juice, garlic, and fresh herbs. Meal planning  Plan to eat 1 vegetarian meal one day each week. Try to work up to 2 vegetarian meals, if possible. Eat seafood 2 or more times a week. Have healthy snacks readily available, such as: Vegetable sticks with hummus. Greek yogurt. Fruit and nut trail mix. Eat balanced meals throughout the week. This includes: Fruit: 2-3 servings a day Vegetables: 4-5 servings a day Low-fat dairy: 2 servings a day Fish, poultry, or lean meat: 1 serving a day Beans and legumes: 2 or more servings a week Nuts and seeds: 1-2 servings a day Whole grains: 6-8 servings a day Extra-virgin olive oil: 3-4 servings a day Limit red meat and sweets to only a few servings a month What are my food choices? Mediterranean diet Recommended Grains: Whole-grain pasta. Brown rice. Bulgar wheat. Polenta. Couscous. Whole-wheat bread. Orpah Cobb. Vegetables: Artichokes. Beets. Broccoli. Cabbage. Carrots. Eggplant. Green beans. Chard. Kale. Spinach. Onions. Leeks. Peas. Squash. Tomatoes. Peppers. Radishes. Fruits: Apples. Apricots. Avocado. Berries. Bananas. Cherries. Dates. Figs. Grapes. Lemons. Melon. Oranges. Peaches. Plums. Pomegranate. Meats and other protein foods: Beans. Almonds. Sunflower seeds. Pine nuts. Peanuts. Cod. Salmon. Scallops. Shrimp. Tuna. Tilapia. Clams. Oysters. Eggs. Dairy: Low-fat milk. Cheese. Greek yogurt. Beverages: Water. Red wine. Herbal tea. Fats and oils: Extra virgin olive oil. Avocado oil. Grape seed oil. Sweets and desserts: [place] yogurt with honey. Baked apples. Poached pears. Trail mix. Seasoning and other foods: Basil. Cilantro. Coriander. Cumin. Mint. Parsley. Sage. Rosemary. Tarragon. Garlic. Oregano. Thyme. Pepper. Balsalmic vinegar.  Tahini. Hummus. Tomato sauce. Olives. Mushrooms. Limit these Grains: Prepackaged pasta or rice dishes. Prepackaged cereal with added sugar. Vegetables: Deep fried potatoes (french fries). Fruits: Fruit canned in syrup. Meats and other protein foods: Beef. Pork. Lamb. Poultry with skin. Hot dogs. Tomasa Blase. Dairy: Ice cream. Sour cream. Whole milk. Beverages: Juice. Sugar-sweetened soft drinks. Beer. Liquor and spirits. Fats and oils: Butter. Canola oil. Vegetable oil. Beef fat (tallow). Lard. Sweets and desserts: Cookies. Cakes. Pies. Candy. Seasoning and other foods: Mayonnaise. Premade sauces and marinades. The items listed may not be a complete list. Talk with your dietitian about what dietary choices are right for you. Summary The Mediterranean diet includes both food and lifestyle choices. Eat a variety of fresh fruits and vegetables, beans, nuts, seeds, and whole grains. Limit the amount of red meat and sweets that you eat. Talk with your health care provider about whether it is safe for you to drink red wine in moderation. This means 1 glass a day for nonpregnant women and 2 glasses a day for men. A glass of wine equals 5 oz (150 mL). This information is not intended to replace advice given to you by your health care provider. Make sure you discuss any questions you have with your health care provider. Document Released: 10/27/2015 Document Revised:  11/29/2015 Document Reviewed: 10/27/2015 Elsevier Interactive Patient Education  2017 [employer].

## 2022-10-09 ENCOUNTER — Other Ambulatory Visit: Payer: Self-pay | Admitting: Physician Assistant

## 2022-11-05 ENCOUNTER — Telehealth: Payer: Self-pay | Admitting: Physician Assistant

## 2022-11-05 NOTE — Telephone Encounter (Signed)
Pts spouse is calling in wanting to let Huntley Dec know that the pt is needing to go ahead and increase her Rx memantine (NAMENDA) 5 MG.  Pts spouse would like to have a call back.

## 2022-11-06 ENCOUNTER — Other Ambulatory Visit: Payer: Self-pay | Admitting: Physician Assistant

## 2022-11-06 MED ORDER — MEMANTINE HCL 10 MG PO TABS: ORAL_TABLET | ORAL | 11 refills | Status: DC

## 2022-11-06 NOTE — Telephone Encounter (Signed)
Called and left voicemail message.

## 2022-12-04 DIAGNOSIS — I10 Essential (primary) hypertension: Secondary | ICD-10-CM | POA: Diagnosis not present

## 2022-12-04 DIAGNOSIS — F0280 Dementia in other diseases classified elsewhere without behavioral disturbance: Secondary | ICD-10-CM | POA: Diagnosis not present

## 2022-12-04 DIAGNOSIS — N1831 Chronic kidney disease, stage 3a: Secondary | ICD-10-CM | POA: Diagnosis not present

## 2022-12-04 DIAGNOSIS — R454 Irritability and anger: Secondary | ICD-10-CM | POA: Diagnosis not present

## 2022-12-04 DIAGNOSIS — Z0000 Encounter for general adult medical examination without abnormal findings: Secondary | ICD-10-CM | POA: Diagnosis not present

## 2022-12-04 DIAGNOSIS — Z23 Encounter for immunization: Secondary | ICD-10-CM | POA: Diagnosis not present

## 2022-12-04 DIAGNOSIS — E7800 Pure hypercholesterolemia, unspecified: Secondary | ICD-10-CM | POA: Diagnosis not present

## 2022-12-04 DIAGNOSIS — G309 Alzheimer's disease, unspecified: Secondary | ICD-10-CM | POA: Diagnosis not present

## 2023-01-07 ENCOUNTER — Other Ambulatory Visit: Payer: Self-pay | Admitting: Physician Assistant

## 2023-01-11 ENCOUNTER — Ambulatory Visit: Payer: Medicare PPO | Admitting: Physician Assistant

## 2023-01-15 ENCOUNTER — Ambulatory Visit: Payer: Medicare PPO | Admitting: Physician Assistant

## 2023-01-15 ENCOUNTER — Encounter: Payer: Self-pay | Admitting: Physician Assistant

## 2023-01-15 VITALS — HR 74 | Resp 20 | Ht 69.0 in

## 2023-01-15 DIAGNOSIS — F0280 Dementia in other diseases classified elsewhere without behavioral disturbance: Secondary | ICD-10-CM | POA: Diagnosis not present

## 2023-01-15 DIAGNOSIS — G309 Alzheimer's disease, unspecified: Secondary | ICD-10-CM

## 2023-01-15 MED ORDER — MEMANTINE HCL 10 MG PO TABS: ORAL_TABLET | ORAL | 3 refills | Status: AC

## 2023-01-15 MED ORDER — DIVALPROEX SODIUM 125 MG PO DR TAB: 125.0000 mg | DELAYED_RELEASE_TABLET | Freq: Every evening | ORAL | 11 refills | Status: DC

## 2023-01-15 MED ORDER — DONEPEZIL HCL 10 MG PO TABS: ORAL_TABLET | ORAL | 3 refills | Status: AC

## 2023-01-15 NOTE — Progress Notes (Signed)
Assessment/Plan:   Dementia due to Alzheimer's disease with behavioral disturbance   Taylor Vasquez is a very pleasant 77 y.o. RH female with a history of hypertension, hyperlipidemia and a diagnosis of dementia likely due to Alzheimer's disease (LBD could not be ruled out although less likely) as per neuropsychological evaluation on March 2022 seen today in follow up for memory loss. Patient is currently on donepezil 10 mg daily and memantine 10 mg twice daily.  Although memory appears stable, she is much less cooperative than prior.  There is concern of behavioral changes, with more episodes of anger, stating "I am worthless ". as well as episodes of hallucinations and sundowning.  Discussed with her partner Taylor Vasquez starting medications such as Depakote to  help with mood changes and sundowning and she agreed.     Follow up in 6 months. Continue memantine 10 mg twice daily and donepezil 10 mg daily.  Side effects discussed Start Depakote 125 mg nightly may increase to 100 g twice daily if needed for sundowning.  Side effects discussed. Recommend good control of her cardiovascular risk factors Continue to control mood as per PCP     Subjective:    This patient is accompanied in the office by her partner Taylor Vasquez who supplements the history.  Previous records as well as any outside records available were reviewed prior to todays visit. Patient was last seen on 07/12/2022 with MMSE 14/30   Any changes in memory since last visit? "About the same but her mood is not".  She may forget recent conversations or names.  She loves singing shows and [employer] repeats oneself?  Endorsed Disoriented when walking into a room?  Patient denies    Leaving objects in unusual places?  May misplace things but not in unusual places   Wandering behavior?  denies   Any personality changes since last visit?  denies   Any worsening depression?:  Endorsed, on Lexapro Hallucinations or paranoia?  As before, she is  concerned that "people on TV can see [place] and come in the room and tear everything down ", or someone being at the carport and in the yard.  She continues to close curtains around 4 pm  as before Seizures? denies    Any sleep changes?  Denies vivid dreams, and has been for, she "talks a lot in her dreams ".  Denies any other REM behavior or sleepwalking   Sleep apnea?   Denies.   Any hygiene concerns? Denies.  Independent of bathing and dressing?  Endorsed  Does the patient needs help with medications?  Taylor Vasquez  is in charge   Who is in charge of the finances?  Taylor Vasquez is in charge    Any changes in appetite?  denies.   Patient have trouble swallowing? Denies.   Does the patient cook? No Any headaches?   denies   Chronic back pain  denies   Ambulates with difficulty? Denies.   Recent falls or head injuries? denies     Unilateral weakness, numbness or tingling? denies   Any tremors?  Denies   Any anosmia?  Denies   Any incontinence of urine?. Yes.    Any bowel dysfunction?   Denies      Patient lives with her partner Taylor Vasquez Does the patient drive? No longer drives     History on Initial Assessment 05/09/2020: This is a pleasant 77 year old right-handed woman with a history of hypertension, hyperlipidemia, presenting for evaluation of memory loss. Her partner Taylor Vasquez  is present to provide history. She feels her memory is still okay, probably not as good as it used to be since she is older. Taylor Vasquez has known Taylor Vasquez for 40 years, she started noticing changes around 10 years ago. Taylor Vasquez initially noticed directional changes, Taylor Vasquez would be driving and on the way back home, she would "100% pick the wrong way." They moved back to [facility] in 2015, she had stopped driving just before that. She was not getting lost. 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 Taylor Vasquez took over bills in 2014. She does not  cook much, Taylor Vasquez does most of the cooking. She has not left the stove on. She denies misplacing things frequently. She is independent with dressing and bathing, no hygiene concerns. Mood is pretty calm, no paranoia or agitation. Recently she has started seeing people in the backyard every night, saying they are walking down the hill (that is not there). Sleep is good, no wandering behaviors. She denies any headaches, dizziness, diplopia, dysarthria/dysphagia, neck/back pain, bowel/bladder dysfunction, focal numbness/tingling/weakness, anosmia, or tremors. 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 No significant head injuries. They drink a glass of wine every night.   PREVIOUS MEDICATIONS:   CURRENT MEDICATIONS:  Outpatient Encounter Medications as of 01/15/2023  Medication Sig   atorvastatin (LIPITOR) 10 MG tablet 1 tablet   Calcium-Vitamin D-Vitamin K (VIACTIV CALCIUM PLUS D) 650-12.5-40 MG-MCG-MCG CHEW 1 tablet   citalopram (CELEXA) 20 MG tablet Take 20 mg by mouth daily.   HYDROcodone-acetaminophen (NORCO/VICODIN) 5-325 MG tablet Take 1 tablet by mouth every 6 (six) hours as needed.   lisinopril (ZESTRIL) 20 MG tablet 1 tablet   Multiple Vitamins-Minerals (CENTRUM SILVER) tablet 1 tablet   ondansetron (ZOFRAN) 4 MG tablet Take 1 tablet (4 mg total) by mouth every 8 (eight) hours as needed for nausea or vomiting.   [DISCONTINUED] donepezil (ARICEPT) 10 MG tablet TAKE 1 TABLET BY MOUTH DAILY   [DISCONTINUED] memantine (NAMENDA) 10 MG tablet Take 1 tablet twice a day   divalproex (DEPAKOTE) 125 MG DR tablet Take 1 tablet (125 mg total) by mouth at bedtime.   donepezil (ARICEPT) 10 MG tablet TAKE 1 TABLET BY MOUTH DAILY   memantine (NAMENDA) 10 MG tablet Take 1 tablet twice a day   No facility-administered encounter medications on file as of 01/15/2023.       07/12/2022   12:00 PM 01/05/2022   11:00 AM 07/05/2021   11:00 AM  MMSE - Mini Mental State Exam   Orientation to time 0 0 3  Orientation to Place 1 1 1   Registration 3 3 3   Attention/ Calculation 4 2 4   Recall 0 0 0  Language- name 2 objects 1 2 2   Language- repeat 1 1 1   Language- follow 3 step command 2 3 3   Language- read & follow direction 1 1 1   Write a sentence 1 0 0  Copy design 0 0 0  Total score 14 13 18       05/09/2020   10:51 AM  Montreal Cognitive Assessment   Visuospatial/ Executive (0/5) 1  Naming (0/3) 0  Attention: Read list of digits (0/2) 2  Attention: Read list of letters (0/1) 0  Attention: Serial 7 subtraction starting at 100 (0/3) 0  Language: Repeat phrase (0/2) 2  Language : Fluency (0/1) 0  Abstraction (0/2) 0  Delayed Recall (0/5) 0  Orientation (0/6) 1  Total 6  Adjusted Score (based on education) 6    Objective:     PHYSICAL EXAMINATION:    VITALS:   Vitals:   01/15/23 1112  Pulse: 74  Resp: 20  SpO2: 98%  Height: 5\' 9"  (1.753 m)    GEN:  The patient appears stated age and is in NAD. HEENT:  Normocephalic, atraumatic.   Neurological examination:  General: NAD, well-groomed, appears stated age. Orientation: The patient is alert. Oriented to person, not to place and date Cranial nerves: There is good facial symmetry.The speech is fluent and clear. No aphasia or dysarthria. Fund of knowledge is reduced. Recent and remote memory are impaired. Attention and concentration are reduced.  Able to name objects and repeat phrases.  Hearing is intact to conversational tone.   Sensation: Sensation is intact to light touch throughout Motor: Strength is at least antigravity x4. DTR's 2/4 in UE/LE     Movement examination: Tone: There is normal tone in the UE/LE Abnormal movements:  no tremor.  No myoclonus.  No asterixis.   Coordination:  There is no decremation with RAM's. Normal finger to nose  Gait and Station: The patient has no difficulty arising out of a deep-seated chair without the use of the hands. The patient's stride length is  good.  Gait is cautious and narrow.    Thank you for allowing [place] the opportunity to participate in the care of this nice patient. Please do not hesitate to contact us for any questions or concerns.   Total time spent on today's visit was 23 minutes dedicated to this patient today, preparing to see patient, examining the patient, ordering tests and/or medications and counseling the patient, documenting clinical information in the EHR or other health record, independently interpreting results and communicating results to the patient/family, discussing treatment and goals, answering patient's questions and coordinating care.  Cc:  Shon Hale, MD  Marlowe Kays 01/15/2023 1:11 PM

## 2023-01-15 NOTE — Patient Instructions (Signed)
It was a pleasure to see you today at our office.   Recommendations:  Follow up in 6 months Continue donepezil 10 mg daily.   Continue memantine 5 mg   twice a day  Take 1 tab at night , may increase to 1 tab twice a day if needed     Whom to call:  Memory  decline, memory medications: Call our office [phone]   For psychiatric meds, mood meds: Please have your primary care physician manage these medications.      For assessment of decision of mental capacity and competency:  Call Dr. Erick Blinks, geriatric psychiatrist at [phone]  For guidance in geriatric dementia issues please call [facility] [phone]  For guidance regarding [facility] and if placement were needed at the facility, contact Sidney Ace, Social Worker tel: [phone]  If you have any severe symptoms of a stroke, or other severe issues such as confusion,severe chills or fever, etc call 911 or go to the ER as you may need to be evaluated further      RECOMMENDATIONS FOR ALL PATIENTS WITH MEMORY PROBLEMS: 1. Continue to exercise (Recommend 30 minutes of walking everyday, or 3 hours every week) 2. Increase social interactions - continue going to [place] and enjoy social gatherings with friends and family 3. Eat healthy, avoid fried foods and eat more fruits and vegetables 4. Maintain adequate blood pressure, blood sugar, and blood cholesterol level. Reducing the risk of stroke and cardiovascular disease also helps promoting better memory. 5. Avoid stressful situations. Live a simple life and avoid aggravations. Organize your time and prepare for the next day in anticipation. 6. Sleep well, avoid any interruptions of sleep and avoid any distractions in the bedroom that may interfere with adequate sleep quality 7. Avoid sugar, avoid sweets as there is a strong link between excessive sugar intake, diabetes, and cognitive impairment We discussed the Mediterranean diet,  which has been shown to help patients reduce the risk of progressive memory disorders and reduces cardiovascular risk. This includes eating fish, eat fruits and green leafy vegetables, nuts like almonds and hazelnuts, walnuts, and also use olive oil. Avoid fast foods and fried foods as much as possible. Avoid sweets and sugar as sugar use has been linked to worsening of memory function.  There is always a concern of gradual progression of memory problems. If this is the case, then we may need to adjust level of care according to patient needs. Support, both to the patient and caregiver, should then be put into place.    The Alzheimer's Association is here all day, every day for people facing Alzheimer's disease through our free 24/7 Helpline: 4254460344. The Helpline provides reliable information and support to all those who need assistance, such as individuals living with memory loss, Alzheimer's or other dementia, caregivers, health care professionals and the public.  Our highly trained and knowledgeable staff can help you with: Understanding memory loss, dementia and Alzheimer's  Medications and other treatment options  General information about aging and brain health  Skills to provide quality care and to find the best care from professionals  Legal, financial and living-arrangement decisions Our Helpline also features: Confidential care consultation provided by master's level clinicians who can help with decision-making support, crisis assistance and education on issues families face every day  Help in a caller's preferred language using our translation service that features more than 200 languages and dialects  Referrals to local community programs, services and ongoing support  FALL PRECAUTIONS: Be cautious when walking. Scan the area for obstacles that may increase the risk of trips and falls. When getting up in the mornings, sit up at the edge of the bed for a few minutes before  getting out of bed. Consider elevating the bed at the head end to avoid drop of blood pressure when getting up. Walk always in a well-lit room (use night lights in the walls). Avoid area rugs or power cords from appliances in the middle of the walkways. Use a walker or a cane if necessary and consider physical therapy for balance exercise. Get your eyesight checked regularly.  FINANCIAL OVERSIGHT: Supervision, especially oversight when making financial decisions or transactions is also recommended.  HOME SAFETY: Consider the safety of the kitchen when operating appliances like stoves, microwave oven, and blender. Consider having supervision and share cooking responsibilities until no longer able to participate in those. Accidents with firearms and other hazards in the house should be identified and addressed as well.   ABILITY TO BE LEFT ALONE: If patient is unable to contact 911 operator, consider using LifeLine, or when the need is there, arrange for someone to stay with patients. Smoking is a fire hazard, consider supervision or cessation. Risk of wandering should be assessed by caregiver and if detected at any point, supervision and safe proof recommendations should be instituted.  MEDICATION SUPERVISION: Inability to self-administer medication needs to be constantly addressed. Implement a mechanism to ensure safe administration of the medications.        Mediterranean Diet A Mediterranean diet refers to food and lifestyle choices that are based on the traditions of countries located on the [place]. This way of eating has been shown to help prevent certain conditions and improve outcomes for people who have chronic diseases, like kidney disease and heart disease. What are tips for following this plan? Lifestyle  Cook and eat meals together with your family, when possible. Drink enough fluid to keep your urine clear or pale yellow. Be physically active every day. This  includes: Aerobic exercise like running or swimming. Leisure activities like gardening, walking, or housework. Get 7-8 hours of sleep each night. If recommended by your health care provider, drink red wine in moderation. This means 1 glass a day for nonpregnant women and 2 glasses a day for men. A glass of wine equals 5 oz (150 mL). Reading food labels  Check the serving size of packaged foods. For foods such as rice and pasta, the serving size refers to the amount of cooked product, not dry. Check the total fat in packaged foods. Avoid foods that have saturated fat or trans fats. Check the ingredients list for added sugars, such as corn syrup. Shopping  At the grocery store, buy most of your food from the areas near the walls of the store. This includes: Fresh fruits and vegetables (produce). Grains, beans, nuts, and seeds. Some of these may be available in unpackaged forms or large amounts (in bulk). Fresh seafood. Poultry and eggs. Low-fat dairy products. Buy whole ingredients instead of prepackaged foods. Buy fresh fruits and vegetables in-season from local farmers markets. Buy frozen fruits and vegetables in resealable bags. If you do not have access to quality fresh seafood, buy precooked frozen shrimp or canned fish, such as tuna, salmon, or sardines. Buy small amounts of raw or cooked vegetables, salads, or olives from the deli or salad bar at your store. Stock your pantry so you always have certain foods on hand, such as  olive oil, canned tuna, canned tomatoes, rice, pasta, and beans. Cooking  Cook foods with extra-virgin olive oil instead of using butter or other vegetable oils. Have meat as a side dish, and have vegetables or grains as your main dish. This means having meat in small portions or adding small amounts of meat to foods like pasta or stew. Use beans or vegetables instead of meat in common dishes like chili or lasagna. Experiment with different cooking methods. Try  roasting or broiling vegetables instead of steaming or sauteing them. Add frozen vegetables to soups, stews, pasta, or rice. Add nuts or seeds for added healthy fat at each meal. You can add these to yogurt, salads, or vegetable dishes. Marinate fish or vegetables using olive oil, lemon juice, garlic, and fresh herbs. Meal planning  Plan to eat 1 vegetarian meal one day each week. Try to work up to 2 vegetarian meals, if possible. Eat seafood 2 or more times a week. Have healthy snacks readily available, such as: Vegetable sticks with hummus. Greek yogurt. Fruit and nut trail mix. Eat balanced meals throughout the week. This includes: Fruit: 2-3 servings a day Vegetables: 4-5 servings a day Low-fat dairy: 2 servings a day Fish, poultry, or lean meat: 1 serving a day Beans and legumes: 2 or more servings a week Nuts and seeds: 1-2 servings a day Whole grains: 6-8 servings a day Extra-virgin olive oil: 3-4 servings a day Limit red meat and sweets to only a few servings a month What are my food choices? Mediterranean diet Recommended Grains: Whole-grain pasta. Brown rice. Bulgar wheat. Polenta. Couscous. Whole-wheat bread. Orpah Cobb. Vegetables: Artichokes. Beets. Broccoli. Cabbage. Carrots. Eggplant. Green beans. Chard. Kale. Spinach. Onions. Leeks. Peas. Squash. Tomatoes. Peppers. Radishes. Fruits: Apples. Apricots. Avocado. Berries. Bananas. Cherries. Dates. Figs. Grapes. Lemons. Melon. Oranges. Peaches. Plums. Pomegranate. Meats and other protein foods: Beans. Almonds. Sunflower seeds. Pine nuts. Peanuts. Cod. Salmon. Scallops. Shrimp. Tuna. Tilapia. Clams. Oysters. Eggs. Dairy: Low-fat milk. Cheese. Greek yogurt. Beverages: Water. Red wine. Herbal tea. Fats and oils: Extra virgin olive oil. Avocado oil. Grape seed oil. Sweets and desserts: [place] yogurt with honey. Baked apples. Poached pears. Trail mix. Seasoning and other foods: Basil. Cilantro. Coriander. Cumin. Mint.  Parsley. Sage. Rosemary. Tarragon. Garlic. Oregano. Thyme. Pepper. Balsalmic vinegar. Tahini. Hummus. Tomato sauce. Olives. Mushrooms. Limit these Grains: Prepackaged pasta or rice dishes. Prepackaged cereal with added sugar. Vegetables: Deep fried potatoes (french fries). Fruits: Fruit canned in syrup. Meats and other protein foods: Beef. Pork. Lamb. Poultry with skin. Hot dogs. Tomasa Blase. Dairy: Ice cream. Sour cream. Whole milk. Beverages: Juice. Sugar-sweetened soft drinks. Beer. Liquor and spirits. Fats and oils: Butter. Canola oil. Vegetable oil. Beef fat (tallow). Lard. Sweets and desserts: Cookies. Cakes. Pies. Candy. Seasoning and other foods: Mayonnaise. Premade sauces and marinades. The items listed may not be a complete list. Talk with your dietitian about what dietary choices are right for you. Summary The Mediterranean diet includes both food and lifestyle choices. Eat a variety of fresh fruits and vegetables, beans, nuts, seeds, and whole grains. Limit the amount of red meat and sweets that you eat. Talk with your health care provider about whether it is safe for you to drink red wine in moderation. This means 1 glass a day for nonpregnant women and 2 glasses a day for men. A glass of wine equals 5 oz (150 mL). This information is not intended to replace advice given to you by your health care provider. Make sure you discuss any  questions you have with your health care provider. Document Released: 10/27/2015 Document Revised: 11/29/2015 Document Reviewed: 10/27/2015 Elsevier Interactive Patient Education  2017 [employer].

## 2023-02-12 DIAGNOSIS — G309 Alzheimer's disease, unspecified: Secondary | ICD-10-CM | POA: Diagnosis not present

## 2023-02-12 DIAGNOSIS — R451 Restlessness and agitation: Secondary | ICD-10-CM | POA: Diagnosis not present

## 2023-02-12 DIAGNOSIS — F0280 Dementia in other diseases classified elsewhere without behavioral disturbance: Secondary | ICD-10-CM | POA: Diagnosis not present

## 2023-03-05 DIAGNOSIS — G309 Alzheimer's disease, unspecified: Secondary | ICD-10-CM | POA: Diagnosis not present

## 2023-03-05 DIAGNOSIS — F0280 Dementia in other diseases classified elsewhere without behavioral disturbance: Secondary | ICD-10-CM | POA: Diagnosis not present

## 2023-03-05 DIAGNOSIS — R4689 Other symptoms and signs involving appearance and behavior: Secondary | ICD-10-CM | POA: Diagnosis not present

## 2023-06-10 IMAGING — MG MM DIGITAL SCREENING BILAT W/ TOMO AND CAD
8 series · 8 of 24 positions shown · non-contrast
Comparison: Previous exam(s).

CLINICAL DATA: Screening.

EXAM:
DIGITAL SCREENING BILATERAL MAMMOGRAM WITH TOMOSYNTHESIS AND CAD
TECHNIQUE: Bilateral screening digital craniocaudal and mediolateral oblique
mammograms were obtained. Bilateral screening digital breast
tomosynthesis was performed. The images were evaluated with
computer-aided detection.

[R MLO synth-2D]
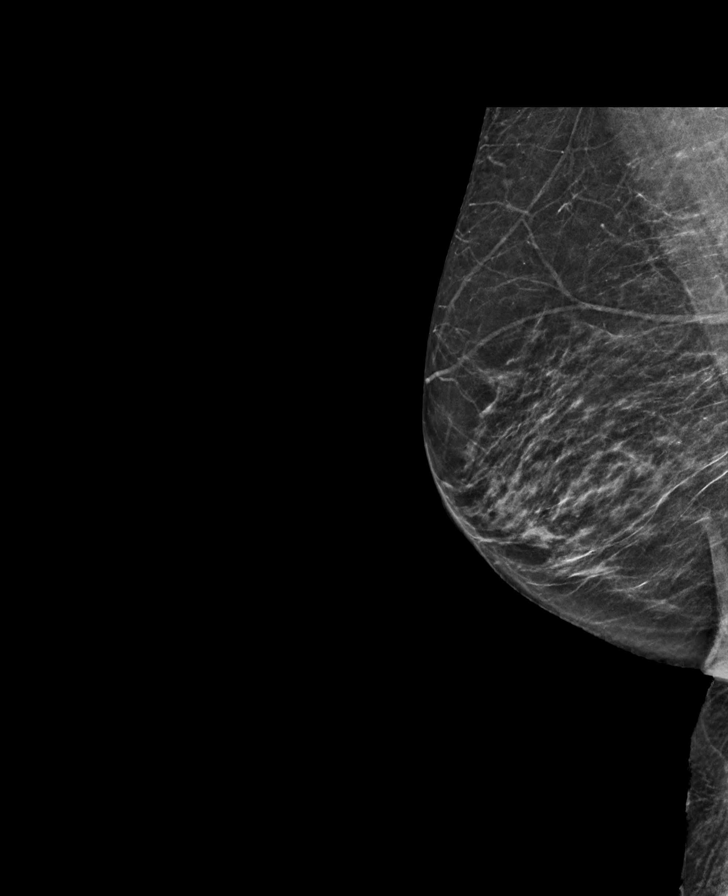

[R CC synth-2D]
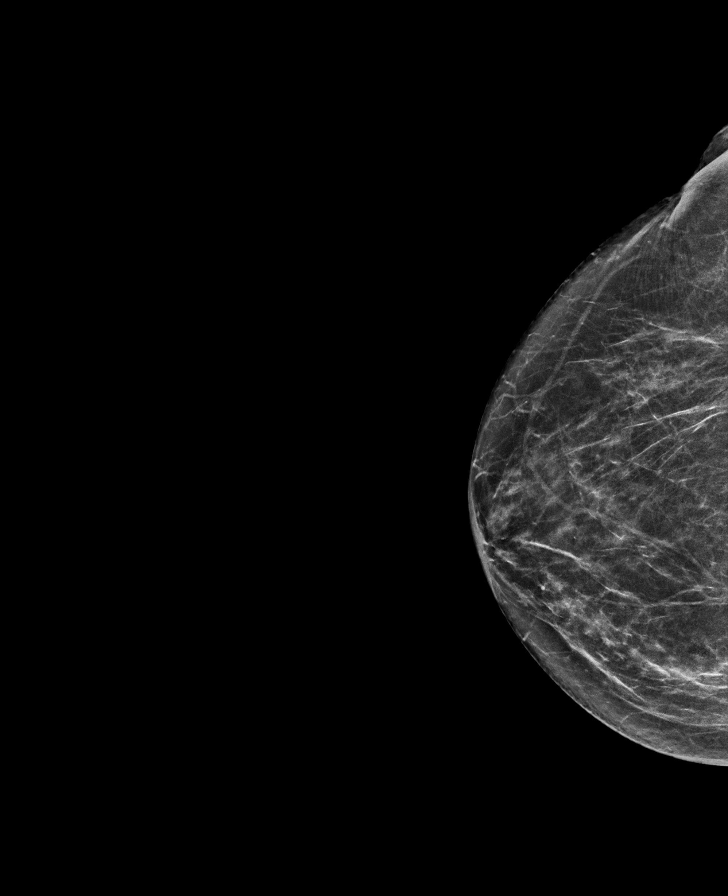

[L CC synth-2D]
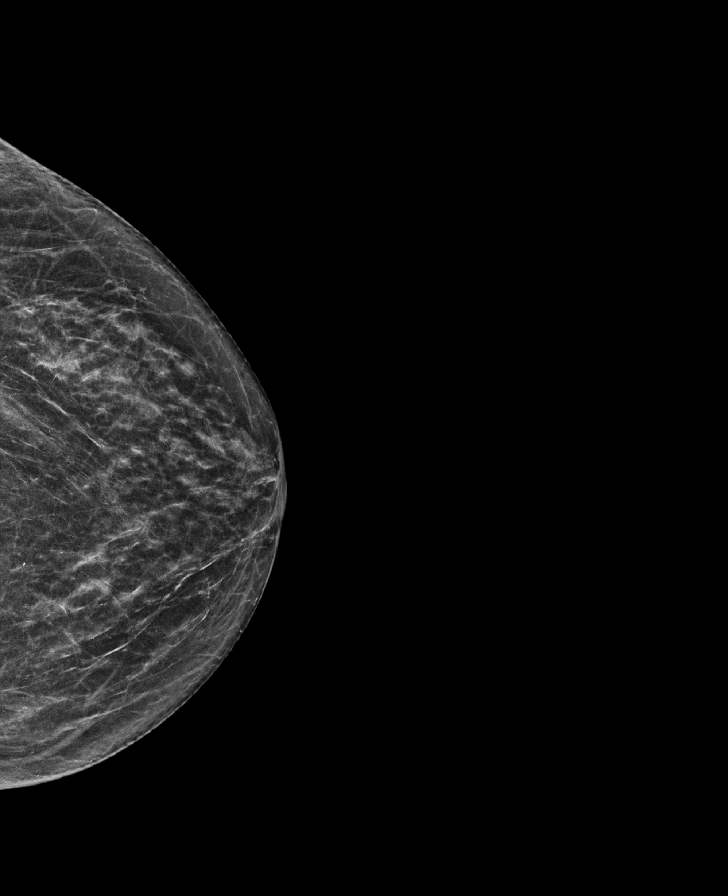

[L MLO synth-2D]
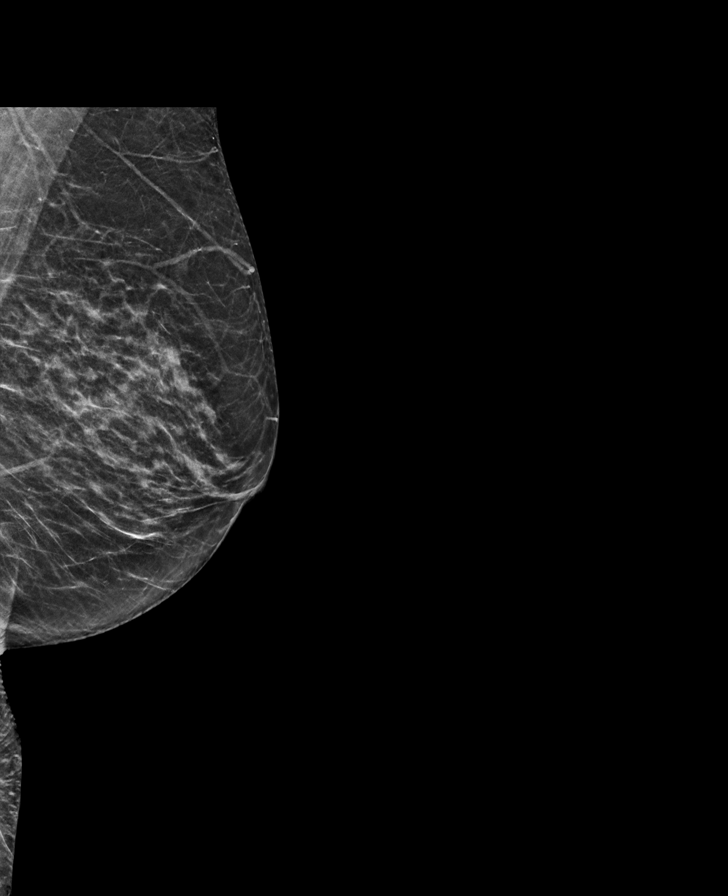

[R CC tomo · tomo slice 30/59.0]
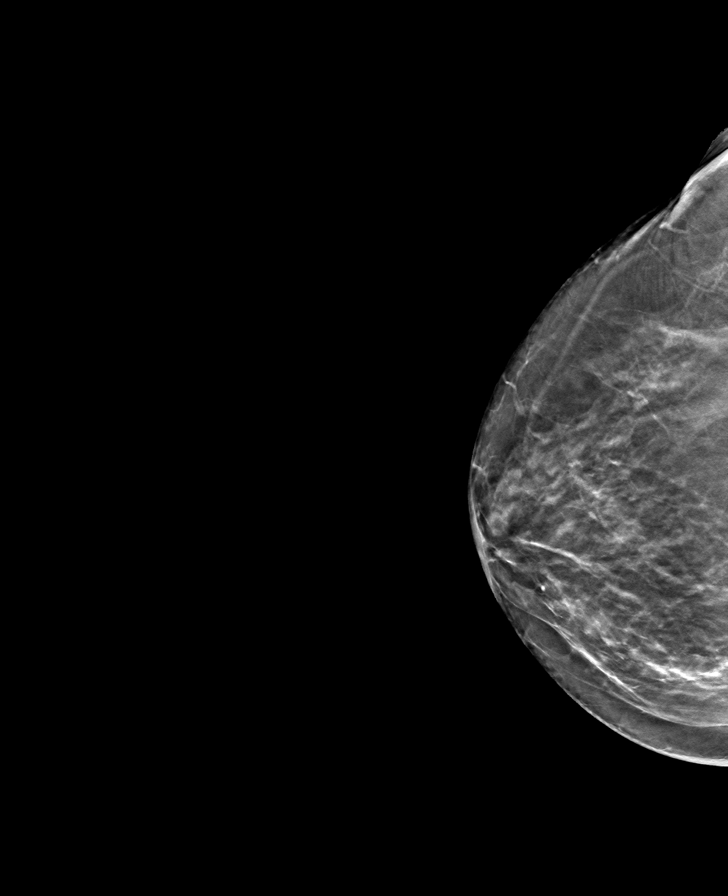

[L CC tomo · tomo slice 27/53.0]
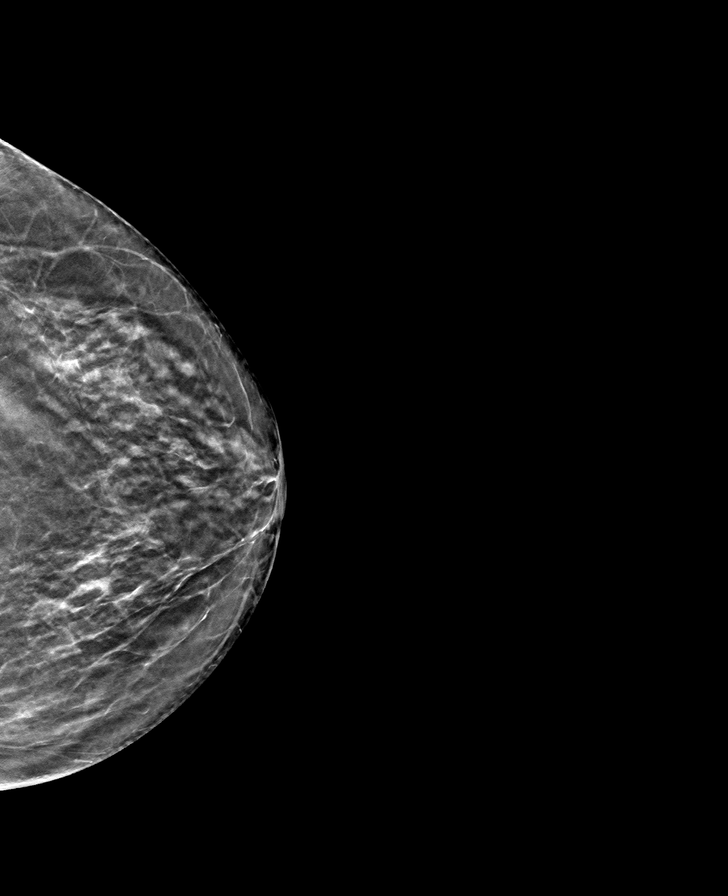

[R MLO tomo · tomo slice 29/56.0]
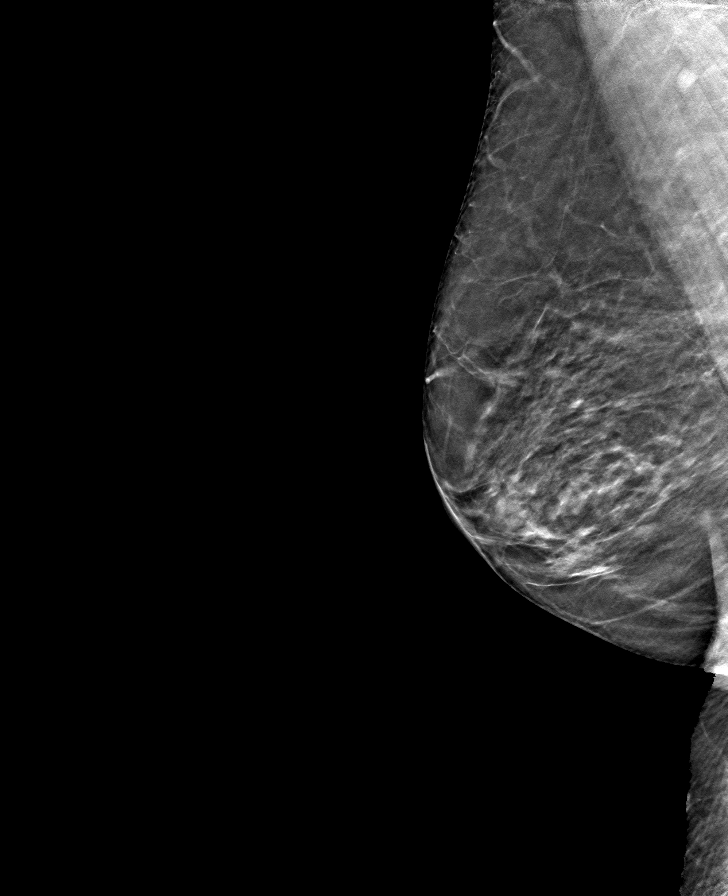

[L MLO tomo · tomo slice 27/52.0]
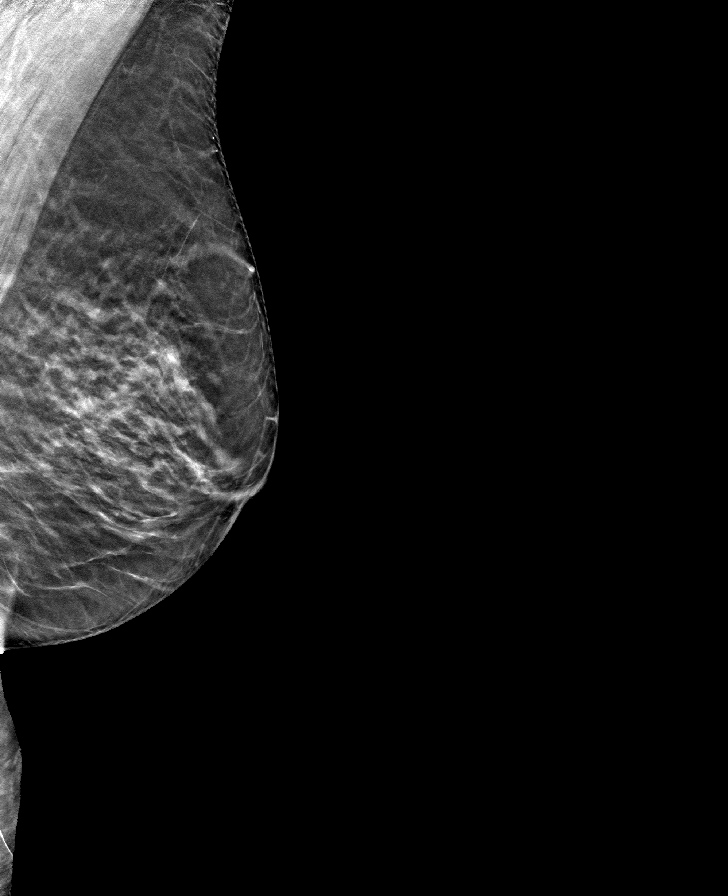

[8 of 24 positions shown; findings below may reference images not displayed]

ACR Breast Density Category b: There are scattered areas of
fibroglandular density.
FINDINGS: There are no findings suspicious for malignancy.
IMPRESSION: No mammographic evidence of malignancy. A result letter of this
screening mammogram will be mailed directly to the patient.

RECOMMENDATION:
Screening mammogram in one year. (Code:[id number])

BI-RADS CATEGORY  1: Negative.

## 2023-07-15 ENCOUNTER — Telehealth: Payer: Self-pay | Admitting: Physician Assistant

## 2023-07-15 NOTE — Telephone Encounter (Signed)
 Pt. Spouse is bringing the Pt and she doesn't wait well and has very bad demansia, wanted toi warn and discus ways to make visit smooth. She also may refuse to come

## 2023-07-16 ENCOUNTER — Encounter: Payer: Self-pay | Admitting: Physician Assistant

## 2023-07-16 ENCOUNTER — Ambulatory Visit: Payer: Self-pay | Admitting: Physician Assistant

## 2023-07-16 DIAGNOSIS — G309 Alzheimer's disease, unspecified: Secondary | ICD-10-CM

## 2023-07-16 DIAGNOSIS — F0280 Dementia in other diseases classified elsewhere without behavioral disturbance: Secondary | ICD-10-CM | POA: Diagnosis not present

## 2023-07-16 NOTE — Patient Instructions (Signed)
 It was a pleasure to see you today at our office.   Recommendations:  Follow up in 6 months Ok to dc donepezil  10 mg daily.   At some point Ok to dc memantine  5 mg   Continue Seroquel . Consider memory care     Whom to call:  Memory  decline, memory medications: Call our office [phone]   For psychiatric meds, mood meds: Please have your primary care physician manage these medications.      For assessment of decision of mental capacity and competency:  Call Dr. Laverne Potter, geriatric psychiatrist at [phone]  For guidance in geriatric dementia issues please call [facility] [phone]  For guidance regarding [facility] and if placement were needed at the facility, contact Forrestine Ike, Social Worker tel: [phone]  If you have any severe symptoms of a stroke, or other severe issues such as confusion,severe chills or fever, etc call 911 or go to the ER as you may need to be evaluated further      RECOMMENDATIONS FOR ALL PATIENTS WITH MEMORY PROBLEMS: 1. Continue to exercise (Recommend 30 minutes of walking everyday, or 3 hours every week) 2. Increase social interactions - continue going to [place] and enjoy social gatherings with friends and family 3. Eat healthy, avoid fried foods and eat more fruits and vegetables 4. Maintain adequate blood pressure, blood sugar, and blood cholesterol level. Reducing the risk of stroke and cardiovascular disease also helps promoting better memory. 5. Avoid stressful situations. Live a simple life and avoid aggravations. Organize your time and prepare for the next day in anticipation. 6. Sleep well, avoid any interruptions of sleep and avoid any distractions in the bedroom that may interfere with adequate sleep quality 7. Avoid sugar, avoid sweets as there is a strong link between excessive sugar intake, diabetes, and cognitive impairment We discussed the Mediterranean diet, which has been shown to  help patients reduce the risk of progressive memory disorders and reduces cardiovascular risk. This includes eating fish, eat fruits and green leafy vegetables, nuts like almonds and hazelnuts, walnuts, and also use olive oil. Avoid fast foods and fried foods as much as possible. Avoid sweets and sugar as sugar use has been linked to worsening of memory function.  There is always a concern of gradual progression of memory problems. If this is the case, then we may need to adjust level of care according to patient needs. Support, both to the patient and caregiver, should then be put into place.    The Alzheimer's Association is here all day, every day for people facing Alzheimer's disease through our free 24/7 Helpline: (727)225-7445. The Helpline provides reliable information and support to all those who need assistance, such as individuals living with memory loss, Alzheimer's or other dementia, caregivers, health care professionals and the public.  Our highly trained and knowledgeable staff can help you with: Understanding memory loss, dementia and Alzheimer's  Medications and other treatment options  General information about aging and brain health  Skills to provide quality care and to find the best care from professionals  Legal, financial and living-arrangement decisions Our Helpline also features: Confidential care consultation provided by master's level clinicians who can help with decision-making support, crisis assistance and education on issues families face every day  Help in a caller's preferred language using our translation service that features more than 200 languages and dialects  Referrals to local community programs, services and ongoing support     FALL PRECAUTIONS: Be  cautious when walking. Scan the area for obstacles that may increase the risk of trips and falls. When getting up in the mornings, sit up at the edge of the bed for a few minutes before getting out of bed.  Consider elevating the bed at the head end to avoid drop of blood pressure when getting up. Walk always in a well-lit room (use night lights in the walls). Avoid area rugs or power cords from appliances in the middle of the walkways. Use a walker or a cane if necessary and consider physical therapy for balance exercise. Get your eyesight checked regularly.  FINANCIAL OVERSIGHT: Supervision, especially oversight when making financial decisions or transactions is also recommended.  HOME SAFETY: Consider the safety of the kitchen when operating appliances like stoves, microwave oven, and blender. Consider having supervision and share cooking responsibilities until no longer able to participate in those. Accidents with firearms and other hazards in the house should be identified and addressed as well.   ABILITY TO BE LEFT ALONE: If patient is unable to contact 911 operator, consider using LifeLine, or when the need is there, arrange for someone to stay with patients. Smoking is a fire hazard, consider supervision or cessation. Risk of wandering should be assessed by caregiver and if detected at any point, supervision and safe proof recommendations should be instituted.  MEDICATION SUPERVISION: Inability to self-administer medication needs to be constantly addressed. Implement a mechanism to ensure safe administration of the medications.        Mediterranean Diet A Mediterranean diet refers to food and lifestyle choices that are based on the traditions of countries located on the [place] [place]. This way of eating has been shown to help prevent certain conditions and improve outcomes for people who have chronic diseases, like kidney disease and heart disease. What are tips for following this plan? Lifestyle  Cook and eat meals together with your family, when possible. Drink enough fluid to keep your urine clear or pale yellow. Be physically active every day. This includes: Aerobic exercise like  running or swimming. Leisure activities like gardening, walking, or housework. Get 7-8 hours of sleep each night. If recommended by your health care provider, drink red wine in moderation. This means 1 glass a day for nonpregnant women and 2 glasses a day for men. A glass of wine equals 5 oz (150 mL). Reading food labels  Check the serving size of packaged foods. For foods such as rice and pasta, the serving size refers to the amount of cooked product, not dry. Check the total fat in packaged foods. Avoid foods that have saturated fat or trans fats. Check the ingredients list for added sugars, such as corn syrup. Shopping  At the grocery store, buy most of your food from the areas near the walls of the store. This includes: Fresh fruits and vegetables (produce). Grains, beans, nuts, and seeds. Some of these may be available in unpackaged forms or large amounts (in bulk). Fresh seafood. Poultry and eggs. Low-fat dairy products. Buy whole ingredients instead of prepackaged foods. Buy fresh fruits and vegetables in-season from local farmers markets. Buy frozen fruits and vegetables in resealable bags. If you do not have access to quality fresh seafood, buy precooked frozen shrimp or canned fish, such as tuna, salmon, or sardines. Buy small amounts of raw or cooked vegetables, salads, or olives from the deli or salad bar at your store. Stock your pantry so you always have certain foods on hand, such as olive oil, canned  tuna, canned tomatoes, rice, pasta, and beans. Cooking  Cook foods with extra-virgin olive oil instead of using butter or other vegetable oils. Have meat as a side dish, and have vegetables or grains as your main dish. This means having meat in small portions or adding small amounts of meat to foods like pasta or stew. Use beans or vegetables instead of meat in common dishes like chili or lasagna. Experiment with different cooking methods. Try roasting or broiling vegetables  instead of steaming or sauteing them. Add frozen vegetables to soups, stews, pasta, or rice. Add nuts or seeds for added healthy fat at each meal. You can add these to yogurt, salads, or vegetable dishes. Marinate fish or vegetables using olive oil, lemon juice, garlic, and fresh herbs. Meal planning  Plan to eat 1 vegetarian meal one day each week. Try to work up to 2 vegetarian meals, if possible. Eat seafood 2 or more times a week. Have healthy snacks readily available, such as: Vegetable sticks with hummus. Greek yogurt. Fruit and nut trail mix. Eat balanced meals throughout the week. This includes: Fruit: 2-3 servings a day Vegetables: 4-5 servings a day Low-fat dairy: 2 servings a day Fish, poultry, or lean meat: 1 serving a day Beans and legumes: 2 or more servings a week Nuts and seeds: 1-2 servings a day Whole grains: 6-8 servings a day Extra-virgin olive oil: 3-4 servings a day Limit red meat and sweets to only a few servings a month What are my food choices? Mediterranean diet Recommended Grains: Whole-grain pasta. Brown rice. Bulgar wheat. Polenta. Couscous. Whole-wheat bread. Dwyane Glad. Vegetables: Artichokes. Beets. Broccoli. Cabbage. Carrots. Eggplant. Green beans. Chard. Kale. Spinach. Onions. Leeks. Peas. Squash. Tomatoes. Peppers. Radishes. Fruits: Apples. Apricots. Avocado. Berries. Bananas. Cherries. Dates. Figs. Grapes. Lemons. Melon. Oranges. Peaches. Plums. Pomegranate. Meats and other protein foods: Beans. Almonds. Sunflower seeds. Pine nuts. Peanuts. Cod. Salmon. Scallops. Shrimp. Tuna. Tilapia. Clams. Oysters. Eggs. Dairy: Low-fat milk. Cheese. Greek yogurt. Beverages: Water. Red wine. Herbal tea. Fats and oils: Extra virgin olive oil. Avocado oil. Grape seed oil. Sweets and desserts: [place] yogurt with honey. Baked apples. Poached pears. Trail mix. Seasoning and other foods: Basil. Cilantro. Coriander. Cumin. Mint. Parsley. Sage. Rosemary. Tarragon.  Garlic. Oregano. Thyme. Pepper. Balsalmic vinegar. Tahini. Hummus. Tomato sauce. Olives. Mushrooms. Limit these Grains: Prepackaged pasta or rice dishes. Prepackaged cereal with added sugar. Vegetables: Deep fried potatoes (french fries). Fruits: Fruit canned in syrup. Meats and other protein foods: Beef. Pork. Lamb. Poultry with skin. Hot dogs. Helene Loader. Dairy: Ice cream. Sour cream. Whole milk. Beverages: Juice. Sugar-sweetened soft drinks. Beer. Liquor and spirits. Fats and oils: Butter. Canola oil. Vegetable oil. Beef fat (tallow). Lard. Sweets and desserts: Cookies. Cakes. Pies. Candy. Seasoning and other foods: Mayonnaise. Premade sauces and marinades. The items listed may not be a complete list. Talk with your dietitian about what dietary choices are right for you. Summary The Mediterranean diet includes both food and lifestyle choices. Eat a variety of fresh fruits and vegetables, beans, nuts, seeds, and whole grains. Limit the amount of red meat and sweets that you eat. Talk with your health care provider about whether it is safe for you to drink red wine in moderation. This means 1 glass a day for nonpregnant women and 2 glasses a day for men. A glass of wine equals 5 oz (150 mL). This information is not intended to replace advice given to you by your health care provider. Make sure you discuss any questions you have  with your health care provider. Document Released: 10/27/2015 Document Revised: 11/29/2015 Document Reviewed: 10/27/2015 Elsevier Interactive Patient Education  2017 [employer].

## 2023-07-16 NOTE — Progress Notes (Addendum)
 Assessment/Plan:   Dementia likely due to Alzheimer's disease with behavioral disturbance   Taylor Vasquez is a very pleasant 78 y.o. RH female with a history of hypertension, hyperlipidemia and a diagnosis of dementia likely due to Alzheimer's disease (LBD could not be ruled out although less likely) as per neuropsychological evaluation on March 2022 seen today in follow up for memory loss. Patient is currently on donepezil  10 mg daily and memantine  10 mg twice daily.  Cognitive decline is noted, she needs more help with ADLs.  Mood and sundowning episodes are still present.  Try Depakote  in the past, but she is now on Seroquel instead by PCP, which seems to be therapeutic.  Overall, her status has worsened and we discussed goals of care.  Her partner Taylor Vasquez, entertaining memory care facility at this time cognitive and social stimulation as well as for safety as she will be needing 24/7 monitoring.  Follow up in 6 months. Continue memantine  10 mg twice daily and donepezil  10 mg daily, side effects discussed Continue quetiapine 25 mg twice daily, apartment may consider increasing it to 50 mg daytime. Recommend good control of her cardiovascular risk factors Continue to control mood as per PCP Agree with memory care for safety, cognitive and social stimulation    Subjective:    This patient is accompanied in the office by her partner Taylor Vasquez who supplements the history.  Previous records as well as any outside records available were reviewed prior to todays visit. Patient was last seen on 01/15/2023   Any changes in memory since last visit? " It is worse" her partner says.  She is not interested in any activities, she only watches TV.  At times she is unable to recognize Taylor Vasquez, does not remember her name which brings significant sadness to her partner.   Repeats oneself?  Endorsed Disoriented when walking into a room?  Patient denies . In fact no one else should be living in her house saying "it  s my house and only my house!".   Leaving objects?  May misplace things but not in unusual places   Wandering behavior?  denies   Any personality changes since last visit?  She is "a dear "than before, she is more irritable, more verbal. Any worsening depression?:  Mood is not yet completely controlled "if she is mad it is mad"-car outside of Hallucinations or paranoia? She continues to be concerned about people on TV being able to see her room.  She continues to close curtains around 4 PM before. Seizures? denies    Any sleep changes?  Sleeps well.  Denies vivid dreams, REM behavior or sleepwalking   Sleep apnea?   Denies.   Any hygiene concerns?  She refuses adamantly to shower, they have a caregiver who helps with these issues. Independent of bathing and dressing?  Endorsed  Does the patient needs help with medications?  Taylor Vasquez  is in charge   Who is in charge of the finances?  Taylor Vasquez is in charge     Any changes in appetite? "Very good, she eats everything"    Patient have trouble swallowing? Denies.   Does the patient cook? No Any headaches?   denies   Chronic back pain  denies   Ambulates with difficulty? Denies.    Recent falls or head injuries? denies     Unilateral weakness, numbness or tingling? denies   Any tremors?  Denies   Any anosmia?  Denies   Any incontinence of urine?  Endorsed  Any bowel dysfunction?   Denies      Patient lives with her partner Taylor Vasquez Does the patient drive? No longer drives  She drinks her non alcoholic beer.    History on Initial Assessment 05/09/2020: This is a pleasant 78 year old right-handed woman with a history of hypertension, hyperlipidemia, presenting for evaluation of memory loss. Her partner Taylor Vasquez is present to provide history. She feels her memory is still okay, probably not as good as it used to be since she is older. Taylor Vasquez has known [name] for 40 years, she started noticing changes around 10 years ago. Taylor Vasquez initially noticed directional  changes, Taylor Vasquez would be driving and on the way back home, she would "100% pick the wrong way." They moved back to [facility] in 2015, she had stopped driving just before that. She was not getting lost. 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 Taylor Vasquez took over bills in 2014. She does not cook much, Taylor Vasquez does most of the cooking. She has not left the stove on. She denies misplacing things frequently. She is independent with dressing and bathing, no hygiene concerns. Mood is pretty calm, no paranoia or agitation. Recently she has started seeing people in the backyard every night, saying they are walking down the hill (that is not there). Sleep is good, no wandering behaviors. She denies any headaches, dizziness, diplopia, dysarthria/dysphagia, neck/back pain, bowel/bladder dysfunction, focal numbness/tingling/weakness, anosmia, or tremors. 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 No significant head injuries. They drink a glass of wine every night.   PREVIOUS MEDICATIONS:   CURRENT MEDICATIONS:  Outpatient Encounter Medications as of 07/16/2023  Medication Sig   atorvastatin (LIPITOR) 10 MG tablet 1 tablet   Calcium-Vitamin D-Vitamin K (VIACTIV CALCIUM PLUS D) 650-12.5-40 MG-MCG-MCG CHEW 1 tablet   citalopram (CELEXA) 20 MG tablet Take 20 mg by mouth daily.   donepezil  (ARICEPT ) 10 MG tablet TAKE 1 TABLET BY MOUTH DAILY   lisinopril (ZESTRIL) 20 MG tablet 1 tablet   memantine  (NAMENDA ) 10 MG tablet Take 1 tablet twice a day   QUEtiapine (SEROQUEL) 25 MG tablet Take 25 mg by mouth 2 (two) times daily as needed.   [DISCONTINUED] divalproex  (DEPAKOTE ) 125 MG DR tablet Take 1 tablet (125 mg total) by mouth at bedtime. (Patient not taking: Reported on 07/16/2023)   [DISCONTINUED] HYDROcodone -acetaminophen  (NORCO/VICODIN) 5-325 MG tablet Take 1 tablet by mouth every  6 (six) hours as needed.   [DISCONTINUED] Multiple Vitamins-Minerals (CENTRUM SILVER) tablet 1 tablet (Patient not taking: Reported on 07/16/2023)   [DISCONTINUED] ondansetron  (ZOFRAN ) 4 MG tablet Take 1 tablet (4 mg total) by mouth every 8 (eight) hours as needed for nausea or vomiting.   No facility-administered encounter medications on file as of 07/16/2023.       07/12/2022   12:00 PM 01/05/2022   11:00 AM 07/05/2021   11:00 AM  MMSE - Mini Mental State Exam  Orientation to time 0 0 3  Orientation to Place 1 1 1   Registration 3 3 3   Attention/ Calculation 4 2 4   Recall 0 0 0  Language- name 2 objects 1 2 2   Language- repeat 1 1 1   Language- follow 3 step command 2 3 3   Language- read & follow direction 1 1 1   Write a sentence 1 0 0  Copy design 0 0 0  Total  score 14 13 18       05/09/2020   10:51 AM  Montreal Cognitive Assessment   Visuospatial/ Executive (0/5) 1  Naming (0/3) 0  Attention: Read list of digits (0/2) 2  Attention: Read list of letters (0/1) 0  Attention: Serial 7 subtraction starting at 100 (0/3) 0  Language: Repeat phrase (0/2) 2  Language : Fluency (0/1) 0  Abstraction (0/2) 0  Delayed Recall (0/5) 0  Orientation (0/6) 1  Total 6  Adjusted Score (based on education) 6    Objective:     PHYSICAL EXAMINATION:    VITALS:   Vitals:    GEN:  The patient appears stated age and is in NAD. HEENT:  Normocephalic, atraumatic.   Neurological examination:  General: NAD, well-groomed, appears stated age. Orientation: The patient is alert.  Not oriented to person, place or date Cranial nerves: There is good facial symmetry.The speech is fluent and clear. No aphasia or dysarthria. Fund of knowledge is reduced. Recent and remote memory are impaired. Attention and concentration are reduced.  Able to name objects and repeat phrases.  Hearing is intact to conversational tone.   Sensation: Sensation is intact to light touch throughout Motor: Strength is at  least antigravity x4. DTR's 2/4 in UE/LE     Movement examination: Tone: There is normal tone in the UE/LE Abnormal movements:  no tremor.  No myoclonus.  No asterixis.   Coordination:  There is decremation with RAM's.  Abnormal finger to nose  Gait and Station: The patient has no difficulty arising out of a deep-seated chair without the use of the hands. The patient's stride length is good.  Gait is cautious and narrow.    Thank you for allowing us  the opportunity to participate in the care of this nice patient. Please do not hesitate to contact us  for any questions or concerns.   Total time spent on today's visit was 33 minutes dedicated to this patient today, preparing to see patient, examining the patient, ordering tests and/or medications and counseling the patient, documenting clinical information in the EHR or other health record, independently interpreting results and communicating results to the patient/family, discussing treatment and goals, answering patient's questions and coordinating care.  Cc:  Ransom Byers, MD  Tex Filbert 07/16/2023 12:46 PM

## 2023-12-09 DIAGNOSIS — F0280 Dementia in other diseases classified elsewhere without behavioral disturbance: Secondary | ICD-10-CM | POA: Diagnosis not present

## 2023-12-09 DIAGNOSIS — Z23 Encounter for immunization: Secondary | ICD-10-CM | POA: Diagnosis not present

## 2023-12-09 DIAGNOSIS — I10 Essential (primary) hypertension: Secondary | ICD-10-CM | POA: Diagnosis not present

## 2023-12-09 DIAGNOSIS — E7800 Pure hypercholesterolemia, unspecified: Secondary | ICD-10-CM | POA: Diagnosis not present

## 2023-12-09 DIAGNOSIS — R454 Irritability and anger: Secondary | ICD-10-CM | POA: Diagnosis not present

## 2023-12-09 DIAGNOSIS — G309 Alzheimer's disease, unspecified: Secondary | ICD-10-CM | POA: Diagnosis not present

## 2023-12-09 DIAGNOSIS — Z0000 Encounter for general adult medical examination without abnormal findings: Secondary | ICD-10-CM | POA: Diagnosis not present

## 2023-12-09 DIAGNOSIS — N1831 Chronic kidney disease, stage 3a: Secondary | ICD-10-CM | POA: Diagnosis not present

## 2024-01-16 ENCOUNTER — Ambulatory Visit: Admitting: Physician Assistant
